# Patient Record
Sex: Female | Born: 1975 | Race: Black or African American | Hispanic: No | Marital: Single | Smoking: Current every day smoker
Health system: Southern US, Community
[De-identification: ages and names within clinical notes are randomized; demographics above are authoritative.]

## PROBLEM LIST (undated history)

## (undated) DIAGNOSIS — Z789 Other specified health status: Secondary | ICD-10-CM

## (undated) DIAGNOSIS — R51 Headache: Secondary | ICD-10-CM

## (undated) DIAGNOSIS — IMO0002 Reserved for concepts with insufficient information to code with codable children: Secondary | ICD-10-CM

## (undated) DIAGNOSIS — R519 Headache, unspecified: Secondary | ICD-10-CM

## (undated) DIAGNOSIS — D734 Cyst of spleen: Secondary | ICD-10-CM

## (undated) HISTORY — DX: Reserved for concepts with insufficient information to code with codable children: IMO0002

## (undated) HISTORY — PX: NO PAST SURGERIES: SHX2092

## (undated) HISTORY — DX: Headache, unspecified: R51.9

## (undated) HISTORY — DX: Headache: R51

## (undated) HISTORY — DX: Cyst of spleen: D73.4

---

## 1999-03-31 ENCOUNTER — Other Ambulatory Visit: Admission: RE | Admit: 1999-03-31 | Discharge: 1999-03-31 | Payer: Self-pay | Admitting: Obstetrics

## 1999-10-27 ENCOUNTER — Emergency Department (HOSPITAL_COMMUNITY): Admission: EM | Admit: 1999-10-27 | Discharge: 1999-10-27 | Payer: Self-pay | Admitting: Emergency Medicine

## 2002-02-06 ENCOUNTER — Emergency Department (HOSPITAL_COMMUNITY): Admission: EM | Admit: 2002-02-06 | Discharge: 2002-02-06 | Payer: Self-pay | Admitting: Emergency Medicine

## 2003-10-09 ENCOUNTER — Inpatient Hospital Stay (HOSPITAL_COMMUNITY): Admission: AD | Admit: 2003-10-09 | Discharge: 2003-10-11 | Payer: Self-pay | Admitting: Obstetrics

## 2007-07-08 ENCOUNTER — Emergency Department (HOSPITAL_COMMUNITY): Admission: EM | Admit: 2007-07-08 | Discharge: 2007-07-08 | Payer: Self-pay | Admitting: Family Medicine

## 2009-04-28 ENCOUNTER — Emergency Department (HOSPITAL_COMMUNITY): Admission: EM | Admit: 2009-04-28 | Discharge: 2009-04-28 | Payer: Self-pay | Admitting: Family Medicine

## 2009-10-26 ENCOUNTER — Emergency Department (HOSPITAL_COMMUNITY): Admission: EM | Admit: 2009-10-26 | Discharge: 2009-10-26 | Payer: Self-pay | Admitting: Emergency Medicine

## 2010-02-03 ENCOUNTER — Emergency Department (HOSPITAL_COMMUNITY): Admission: EM | Admit: 2010-02-03 | Discharge: 2010-02-03 | Payer: Self-pay | Admitting: Emergency Medicine

## 2011-03-15 LAB — POCT I-STAT, CHEM 8
Chloride: 109 mEq/L (ref 96–112)
Creatinine, Ser: 0.6 mg/dL (ref 0.4–1.2)
Glucose, Bld: 96 mg/dL (ref 70–99)
Hemoglobin: 13.3 g/dL (ref 12.0–15.0)
Potassium: 4.1 mEq/L (ref 3.5–5.1)

## 2011-03-15 LAB — POCT URINALYSIS DIP (DEVICE)
Hgb urine dipstick: NEGATIVE
Ketones, ur: NEGATIVE mg/dL
Protein, ur: 30 mg/dL — AB
Specific Gravity, Urine: 1.015 (ref 1.005–1.030)
pH: 8.5 — ABNORMAL HIGH (ref 5.0–8.0)

## 2011-03-15 LAB — DIFFERENTIAL
Basophils Absolute: 0 10*3/uL (ref 0.0–0.1)
Eosinophils Relative: 1 % (ref 0–5)
Lymphocytes Relative: 23 % (ref 12–46)
Monocytes Absolute: 0.9 10*3/uL (ref 0.1–1.0)

## 2011-03-15 LAB — POCT PREGNANCY, URINE: Preg Test, Ur: NEGATIVE

## 2011-03-15 LAB — CBC
HCT: 36.9 % (ref 36.0–46.0)
Hemoglobin: 12.4 g/dL (ref 12.0–15.0)
RDW: 14 % (ref 11.5–15.5)

## 2011-04-23 ENCOUNTER — Emergency Department (HOSPITAL_COMMUNITY)
Admission: EM | Admit: 2011-04-23 | Discharge: 2011-04-23 | Disposition: A | Discharge status: Home / Self Care | Payer: Medicaid Other | Attending: Emergency Medicine | Admitting: Emergency Medicine

## 2011-04-23 ENCOUNTER — Emergency Department (HOSPITAL_COMMUNITY): Payer: Medicaid Other

## 2011-04-23 DIAGNOSIS — M79609 Pain in unspecified limb: Secondary | ICD-10-CM | POA: Insufficient documentation

## 2011-04-23 DIAGNOSIS — S60229A Contusion of unspecified hand, initial encounter: Secondary | ICD-10-CM | POA: Insufficient documentation

## 2011-04-23 DIAGNOSIS — IMO0002 Reserved for concepts with insufficient information to code with codable children: Secondary | ICD-10-CM | POA: Insufficient documentation

## 2011-10-10 LAB — POCT URINALYSIS DIP (DEVICE)
Glucose, UA: NEGATIVE
Nitrite: NEGATIVE
Operator id: 235561
Protein, ur: 100 — AB
Specific Gravity, Urine: >=1.03
Urobilinogen, UA: 1
pH: 5.5

## 2011-10-10 LAB — CBC
HCT: 38.3
Hemoglobin: 12.6
MCHC: 32.9
MCV: 81.5
Platelets: 228
RBC: 4.7
RDW: 14.1 — ABNORMAL HIGH
WBC: 10.4

## 2011-10-10 LAB — I-STAT 8, (EC8 V) (CONVERTED LAB)
Acid-Base Excess: 1
BUN: 12
Bicarbonate: 26 — ABNORMAL HIGH
Chloride: 107
Glucose, Bld: 91
HCT: 46
Hemoglobin: 15.6 — ABNORMAL HIGH
Operator id: 235561
Potassium: 3.8
Sodium: 143
TCO2: 27
pCO2, Ven: 40.1 — ABNORMAL LOW
pH, Ven: 7.42 — ABNORMAL HIGH

## 2011-10-10 LAB — POCT I-STAT CREATININE
Creatinine, Ser: 0.8
Operator id: 235561

## 2011-10-10 LAB — DIFFERENTIAL
Basophils Absolute: 0
Basophils Relative: 0
Eosinophils Absolute: 0.2
Eosinophils Relative: 2
Lymphocytes Relative: 19
Lymphs Abs: 2
Monocytes Absolute: 0.6
Monocytes Relative: 6
Neutro Abs: 7.7
Neutrophils Relative %: 74

## 2011-10-10 LAB — POCT H PYLORI SCREEN: H. PYLORI SCREEN, POC: POSITIVE — AB

## 2011-10-20 ENCOUNTER — Emergency Department (HOSPITAL_COMMUNITY)
Admission: EM | Admit: 2011-10-20 | Discharge: 2011-10-20 | Disposition: A | Discharge status: Home / Self Care | Payer: Self-pay | Attending: Emergency Medicine | Admitting: Emergency Medicine

## 2011-10-20 DIAGNOSIS — IMO0001 Reserved for inherently not codable concepts without codable children: Secondary | ICD-10-CM | POA: Insufficient documentation

## 2011-10-20 DIAGNOSIS — J020 Streptococcal pharyngitis: Secondary | ICD-10-CM | POA: Insufficient documentation

## 2011-10-20 DIAGNOSIS — R197 Diarrhea, unspecified: Secondary | ICD-10-CM | POA: Insufficient documentation

## 2011-10-20 DIAGNOSIS — R599 Enlarged lymph nodes, unspecified: Secondary | ICD-10-CM | POA: Insufficient documentation

## 2011-10-20 LAB — RAPID STREP SCREEN (MED CTR MEBANE ONLY): Streptococcus, Group A Screen (Direct): POSITIVE — AB

## 2012-02-01 ENCOUNTER — Emergency Department (HOSPITAL_COMMUNITY): Payer: Medicaid Other

## 2012-02-01 ENCOUNTER — Encounter (HOSPITAL_COMMUNITY): Payer: Self-pay

## 2012-02-01 ENCOUNTER — Inpatient Hospital Stay (HOSPITAL_COMMUNITY)
Admission: EM | Admit: 2012-02-01 | Discharge: 2012-02-03 | DRG: 816 | Disposition: A | Discharge status: Home / Self Care | Payer: Medicaid Other | Attending: General Surgery | Admitting: General Surgery

## 2012-02-01 DIAGNOSIS — D72829 Elevated white blood cell count, unspecified: Secondary | ICD-10-CM | POA: Diagnosis present

## 2012-02-01 DIAGNOSIS — R109 Unspecified abdominal pain: Secondary | ICD-10-CM | POA: Diagnosis present

## 2012-02-01 DIAGNOSIS — F17200 Nicotine dependence, unspecified, uncomplicated: Secondary | ICD-10-CM | POA: Diagnosis present

## 2012-02-01 DIAGNOSIS — D735 Infarction of spleen: Secondary | ICD-10-CM

## 2012-02-01 DIAGNOSIS — Z791 Long term (current) use of non-steroidal anti-inflammatories (NSAID): Secondary | ICD-10-CM

## 2012-02-01 DIAGNOSIS — D7389 Other diseases of spleen: Principal | ICD-10-CM | POA: Diagnosis present

## 2012-02-01 DIAGNOSIS — D734 Cyst of spleen: Secondary | ICD-10-CM

## 2012-02-01 HISTORY — DX: Other specified health status: Z78.9

## 2012-02-01 LAB — URINALYSIS, ROUTINE W REFLEX MICROSCOPIC
Bilirubin Urine: NEGATIVE
Glucose, UA: NEGATIVE mg/dL
Hgb urine dipstick: NEGATIVE
Leukocytes, UA: NEGATIVE
Nitrite: NEGATIVE
Protein, ur: 30 mg/dL — AB
Specific Gravity, Urine: 1.029 (ref 1.005–1.030)
Urobilinogen, UA: 0.2 mg/dL (ref 0.0–1.0)
pH: 6 (ref 5.0–8.0)

## 2012-02-01 LAB — CBC
HCT: 35 % — ABNORMAL LOW (ref 36.0–46.0)
Hemoglobin: 11.7 g/dL — ABNORMAL LOW (ref 12.0–15.0)
MCH: 26.8 pg (ref 26.0–34.0)
MCHC: 33.4 g/dL (ref 30.0–36.0)
MCV: 80.1 fL (ref 78.0–100.0)
Platelets: 228 10*3/uL (ref 150–400)
RBC: 4.37 MIL/uL (ref 3.87–5.11)
RDW: 14 % (ref 11.5–15.5)
WBC: 17.1 K/uL — ABNORMAL HIGH (ref 4.0–10.5)

## 2012-02-01 LAB — DIFFERENTIAL
Basophils Absolute: 0 10*3/uL (ref 0.0–0.1)
Basophils Relative: 0 % (ref 0–1)
Eosinophils Absolute: 0 10*3/uL (ref 0.0–0.7)
Eosinophils Relative: 0 % (ref 0–5)
Lymphocytes Relative: 7 % — ABNORMAL LOW (ref 12–46)
Lymphs Abs: 1.2 K/uL (ref 0.7–4.0)
Monocytes Absolute: 0.5 K/uL (ref 0.1–1.0)
Monocytes Relative: 3 % (ref 3–12)
Neutro Abs: 15.4 K/uL — ABNORMAL HIGH (ref 1.7–7.7)
Neutrophils Relative %: 90 % — ABNORMAL HIGH (ref 43–77)

## 2012-02-01 LAB — COMPREHENSIVE METABOLIC PANEL
AST: 18 U/L (ref 0–37)
Albumin: 3.8 g/dL (ref 3.5–5.2)
Alkaline Phosphatase: 117 U/L (ref 39–117)
BUN: 12 mg/dL (ref 6–23)
Potassium: 4 mEq/L (ref 3.5–5.1)
Total Protein: 7.7 g/dL (ref 6.0–8.3)

## 2012-02-01 LAB — COMPREHENSIVE METABOLIC PANEL WITH GFR
ALT: 13 U/L (ref 0–35)
CO2: 24 meq/L (ref 19–32)
Calcium: 9.4 mg/dL (ref 8.4–10.5)
Chloride: 104 meq/L (ref 96–112)
Creatinine, Ser: 0.72 mg/dL (ref 0.50–1.10)
GFR calc Af Amer: >90 mL/min (ref >90)
GFR calc non Af Amer: >90 mL/min (ref >90)
Glucose, Bld: 80 mg/dL (ref 70–99)
Sodium: 139 meq/L (ref 135–145)
Total Bilirubin: 0.3 mg/dL (ref 0.3–1.2)

## 2012-02-01 LAB — POCT PREGNANCY, URINE: Preg Test, Ur: NEGATIVE

## 2012-02-01 LAB — LIPASE, BLOOD: Lipase: 30 U/L (ref 11–59)

## 2012-02-01 LAB — URINE MICROSCOPIC-ADD ON

## 2012-02-01 MED ORDER — HYDROCODONE-ACETAMINOPHEN 5-325 MG PO TABS
1.0000 | ORAL_TABLET | ORAL | Status: DC | PRN
  Administered 2012-02-02 – 2012-02-03 (×3): 2 via ORAL
  Filled 2012-02-01 (×4): qty 2

## 2012-02-01 MED ORDER — HYDROMORPHONE HCL PF 1 MG/ML IJ SOLN
0.5000 mg | Freq: Once | INTRAMUSCULAR | Status: AC
  Administered 2012-02-01: 0.5 mg via INTRAVENOUS
  Filled 2012-02-01: qty 1

## 2012-02-01 MED ORDER — KCL IN DEXTROSE-NACL 20-5-0.45 MEQ/L-%-% IV SOLN
INTRAVENOUS | Status: DC
  Administered 2012-02-01: 22:00:00 via INTRAVENOUS
  Filled 2012-02-01 (×5): qty 1000

## 2012-02-01 MED ORDER — PANTOPRAZOLE SODIUM 40 MG IV SOLR
40.0000 mg | Freq: Two times a day (BID) | INTRAVENOUS | Status: DC
  Administered 2012-02-01: 40 mg via INTRAVENOUS
  Filled 2012-02-01 (×3): qty 40

## 2012-02-01 MED ORDER — INFLUENZA VIRUS VACC SPLIT PF IM SUSP
0.5000 mL | INTRAMUSCULAR | Status: AC
  Administered 2012-02-02: 0.5 mL via INTRAMUSCULAR
  Filled 2012-02-01: qty 0.5

## 2012-02-01 MED ORDER — GI COCKTAIL ~~LOC~~
30.0000 mL | Freq: Once | ORAL | Status: AC
  Administered 2012-02-01: 30 mL via ORAL
  Filled 2012-02-01: qty 30

## 2012-02-01 MED ORDER — ACETAMINOPHEN 325 MG PO TABS: 650.0000 mg | ORAL_TABLET | Freq: Four times a day (QID) | ORAL | Status: DC | PRN

## 2012-02-01 MED ORDER — SODIUM CHLORIDE 0.9 % IV SOLN
999.0000 mL | INTRAVENOUS | Status: DC
  Administered 2012-02-01: 1000 mL via INTRAVENOUS

## 2012-02-01 MED ORDER — IOHEXOL 300 MG/ML  SOLN
100.0000 mL | Freq: Once | INTRAMUSCULAR | Status: AC | PRN
  Administered 2012-02-01: 100 mL via INTRAVENOUS

## 2012-02-01 MED ORDER — HYDROMORPHONE HCL PF 1 MG/ML IJ SOLN
0.5000 mg | INTRAMUSCULAR | Status: DC | PRN
  Administered 2012-02-01: 1 mg via INTRAVENOUS
  Administered 2012-02-02: 0.5 mg via INTRAVENOUS
  Administered 2012-02-02: 1 mg via INTRAVENOUS
  Filled 2012-02-01 (×3): qty 1

## 2012-02-01 MED ORDER — ONDANSETRON HCL 4 MG/2ML IJ SOLN
4.0000 mg | Freq: Four times a day (QID) | INTRAMUSCULAR | Status: DC | PRN
  Administered 2012-02-01 – 2012-02-02 (×2): 4 mg via INTRAVENOUS
  Filled 2012-02-01 (×2): qty 2

## 2012-02-01 MED ORDER — ACETAMINOPHEN 650 MG RE SUPP: 650.0000 mg | Freq: Four times a day (QID) | RECTAL | Status: DC | PRN

## 2012-02-01 MED ORDER — ONDANSETRON 8 MG PO TBDP
8.0000 mg | ORAL_TABLET | Freq: Once | ORAL | Status: AC
  Administered 2012-02-01: 8 mg via ORAL
  Filled 2012-02-01: qty 1

## 2012-02-01 MED ORDER — PIPERACILLIN-TAZOBACTAM 3.375 G IVPB
3.3750 g | Freq: Three times a day (TID) | INTRAVENOUS | Status: DC
  Administered 2012-02-01 – 2012-02-02 (×2): 3.375 g via INTRAVENOUS
  Filled 2012-02-01 (×4): qty 50

## 2012-02-01 MED ORDER — HYDROMORPHONE HCL PF 1 MG/ML IJ SOLN
1.0000 mg | Freq: Once | INTRAMUSCULAR | Status: AC
  Administered 2012-02-01: 1 mg via INTRAVENOUS
  Filled 2012-02-01: qty 1

## 2012-02-01 NOTE — ED Notes (Signed)
Patient reports that she went to work this AM and began having a sudden onset of right upper abd pain that worsened. Patient states she did not eat breakfast this AM.  Patient denies vomiting. Patient states she had a normal BM this AM

## 2012-02-01 NOTE — H&P (Signed)
Taylor Bernard is an 36 y.o. female.   Chief Complaint: Abdominal pain Primary Care: none HPI: 36 year old Afro-American female who was in her normal state of health this morning. She was at work and was getting on the computer when she had abrupt onset of pain mid epigastric, and going to her left upper quadrant. When it first occurred she was sweating, but this resolved. For pain seems to wax and wane. She presented to the ER, pain was relieved by Dilaudid but has subsequently returned. She denies any nausea, vomiting, diarrhea, constipation, or blood in her stool. She's never had this pain before. She was seen in 2008 have positive H. pylori. She also uses nonsteroidals frequently for headaches. She continues to have some discomfort, she was recently treated with some pain medicine, still somewhat tender to palpation in her midabdomen. We were asked to see and evaluate. Abdominal ultrasound shows a normal gallbladder with no gallstones, common bile duct was normal, liver was normal, there is mild ascites there was a complex 10 cm cystic lesion in the spleen. This led to a CT scan that shows a large irregular marginated septated lesion in the spleen 10.2 x 7.4 x 9.8 cm there is a large amount of fluid throughout the peritoneal cavity with the same density as the fluid in the cyst. Suggesting a aerobic sure of the splenic cyst. Patient has no history of trauma, and no other significant medical history.  Past Medical History  Diagnosis Date  . No pertinent past medical history   frequent headaches, about 1 per week for which she take NSAID,  They normally last about 1 day.  Past Surgical History  Procedure Date  . No past surgeries     Family History  Problem Relation Age of Onset  . Hypertension Mother   . Heart failure Mother   . Hypertension Father   Mother with hx of renal failure, father with hx of Cirrhosis    Social History:  reports that she has been smoking.  She has never used  smokeless tobacco. She reports that she drinks alcohol. She reports that she does not use illicit drugs. 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  Home meds;  Nexplanon implantable birth control, Ibuprofen PRN for headaches    Allergies: No Known Allergies  Medications Prior to Admission  Medication Dose Route Frequency Provider Last Rate Last Dose  . 0.9 %  sodium chloride infusion  999 mL Intravenous Continuous Gavin Pound. Ghim, MD 999 mL/hr at 02/01/12 1135 1,000 mL at 02/01/12 1135  . gi cocktail  30 mL Oral Once Gavin Pound. Ghim, MD   30 mL at 02/01/12 0957  . HYDROmorphone (DILAUDID) injection 0.5 mg  0.5 mg Intravenous Once Gavin Pound. Ghim, MD   0.5 mg at 02/01/12 1609  . HYDROmorphone (DILAUDID) injection 1 mg  1 mg Intravenous Once Gavin Pound. Ghim, MD   1 mg at 02/01/12 1133  . influenza  inactive virus vaccine (FLUZONE/FLUARIX) injection 0.5 mL  0.5 mL Intramuscular Tomorrow-1000 Hurman Horn, MD      . iohexol (OMNIPAQUE) 300 MG/ML solution 100 mL  100 mL Intravenous Once PRN Medication Radiologist, MD   100 mL at 02/01/12 1556  . ondansetron (ZOFRAN-ODT) disintegrating tablet 8 mg  8 mg Oral Once Gavin Pound. Ghim, MD   8 mg at 02/01/12 0934   No current outpatient prescriptions on file as of 02/01/2012.  Results for orders placed during the hospital encounter of 02/01/12 (from the past 48 hour(s))  POCT PREGNANCY, URINE     Status: Normal   Collection Time   02/01/12  9:54 AM      Component Value Range Comment   Preg Test, Ur NEGATIVE  NEGATIVE    URINALYSIS, ROUTINE W REFLEX MICROSCOPIC     Status: Abnormal   Collection Time   02/01/12  9:54 AM      Component Value Range Comment   Color, Urine AMBER (*) YELLOW  BIOCHEMICALS MAY BE AFFECTED BY COLOR   APPearance CLOUDY (*) CLEAR     Specific Gravity, Urine 1.029  1.005 - 1.030     pH 6.0  5.0 - 8.0     Glucose, UA NEGATIVE  NEGATIVE (mg/dL)    Hgb urine dipstick NEGATIVE  NEGATIVE      Bilirubin Urine NEGATIVE  NEGATIVE     Ketones, ur TRACE (*) NEGATIVE (mg/dL)    Protein, ur 30 (*) NEGATIVE (mg/dL)    Urobilinogen, UA 0.2  0.0 - 1.0 (mg/dL)    Nitrite NEGATIVE  NEGATIVE     Leukocytes, UA NEGATIVE  NEGATIVE    URINE MICROSCOPIC-ADD ON     Status: Abnormal   Collection Time   02/01/12  9:54 AM      Component Value Range Comment   Squamous Epithelial / LPF RARE  RARE     WBC, UA 0-2  <3 (WBC/hpf)    Bacteria, UA MANY (*) RARE     Urine-Other MUCOUS PRESENT     CBC     Status: Abnormal   Collection Time   02/01/12 11:30 AM      Component Value Range Comment   WBC 17.1 (*) 4.0 - 10.5 (K/uL)    RBC 4.37  3.87 - 5.11 (MIL/uL)    Hemoglobin 11.7 (*) 12.0 - 15.0 (g/dL)    HCT 16.1 (*) 09.6 - 46.0 (%)    MCV 80.1  78.0 - 100.0 (fL)    MCH 26.8  26.0 - 34.0 (pg)    MCHC 33.4  30.0 - 36.0 (g/dL)    RDW 04.5  40.9 - 81.1 (%)    Platelets 228  150 - 400 (K/uL)   DIFFERENTIAL     Status: Abnormal   Collection Time   02/01/12 11:30 AM      Component Value Range Comment   Neutrophils Relative 90 (*) 43 - 77 (%)    Neutro Abs 15.4 (*) 1.7 - 7.7 (K/uL)    Lymphocytes Relative 7 (*) 12 - 46 (%)    Lymphs Abs 1.2  0.7 - 4.0 (K/uL)    Monocytes Relative 3  3 - 12 (%)    Monocytes Absolute 0.5  0.1 - 1.0 (K/uL)    Eosinophils Relative 0  0 - 5 (%)    Eosinophils Absolute 0.0  0.0 - 0.7 (K/uL)    Basophils Relative 0  0 - 1 (%)    Basophils Absolute 0.0  0.0 - 0.1 (K/uL)   COMPREHENSIVE METABOLIC PANEL     Status: Normal   Collection Time   02/01/12 11:30 AM      Component Value Range Comment   Sodium 139  135 - 145 (mEq/L)    Potassium 4.0  3.5 - 5.1 (mEq/L)    Chloride 104  96 - 112 (mEq/L)    CO2 24  19 - 32 (mEq/L)    Glucose, Bld 80  70 - 99 (mg/dL)  BUN 12  6 - 23 (mg/dL)    Creatinine, Ser 4.78  0.50 - 1.10 (mg/dL)    Calcium 9.4  8.4 - 10.5 (mg/dL)    Total Protein 7.7  6.0 - 8.3 (g/dL)    Albumin 3.8  3.5 - 5.2 (g/dL)    AST 18  0 - 37 (U/L)    ALT 13  0 -  35 (U/L)    Alkaline Phosphatase 117  39 - 117 (U/L)    Total Bilirubin 0.3  0.3 - 1.2 (mg/dL)    GFR calc non Af Amer >90  >90 (mL/min)    GFR calc Af Amer >90  >90 (mL/min)   LIPASE, BLOOD     Status: Normal   Collection Time   02/01/12 11:30 AM      Component Value Range Comment   Lipase 30  11 - 59 (U/L)    US Abdomen Complete  02/01/2012  *RADIOLOGY REPORT*  Clinical Data:  Abdominal pain and night sweats  ABDOMINAL ULTRASOUND COMPLETE  Comparison:  None.  Findings:  Gallbladder:  No gallstones, gallbladder wall thickening, or pericholecystic fluid. Negative sonographic Murphy's sign.  Common Bile Duct:  Within normal limits in caliber. Measures 3 mm.  Liver: No focal mass lesion identified.  Within normal limits in parenchymal echogenicity.  There is mild ascites.  IVC:  Appears normal.  Pancreas:  No abnormality identified.  Spleen:  There is a 10 cm complex cystic area with irregular margins and internal septations.  Right kidney:  Normal in size and parenchymal echogenicity.  No evidence of mass or hydronephrosis.  Left kidney:  Normal in size and parenchymal echogenicity.  No evidence of mass or hydronephrosis.  Abdominal Aorta:  No aneurysm identified.  IMPRESSION:  Ascites.  10 cm complex cystic lesion within the spleen.  Differential diagnosis includes prior splenic hemorrhage or infarction, abscess (bacterial, fungal,  or echinococcal), lymphangioma or hemangioma. Neoplasm is felt less likely but lymphoma cannot be completely excluded.  Original Report Authenticated By: Brandon Melnick, M.D.   Ct Abdomen Pelvis W Contrast  02/01/2012  *RADIOLOGY REPORT*  Clinical Data: Left upper quadrant pain for 1 day  CT ABDOMEN AND PELVIS WITH CONTRAST  Technique:  Multidetector CT imaging of the abdomen and pelvis was performed following the standard protocol during bolus administration of intravenous contrast.  Contrast: OMNIPAQUE IOHEXOL 300 MG/ML IV SOLN  Comparison: None.  Findings: There is  mild linear atelectasis of the left lung base. Developing pneumonia cannot be excluded.  No effusion is seen.  The liver enhances with no focal abnormality and no ductal dilatation is seen.  No calcified gallstones are noted.  The pancreas is normal in size and the pancreatic duct is not dilated.  The adrenal glands are unremarkable.   However, there is a large low attenuation irregularly marginated lesion within the spleen measuring 10.2 x 7.4 x 9.8 cm with an attenuation of 20 HU.  There is perhaps slight enhancement anteriorly within the splenic adjacent parenchyma and there is peripheral calcification within this large splenic somewhat loculated lesion.  The primary considerations are that of a splenic cyst either congenital, post-traumatic, or inflammatory with other considerations being that of a hemangioma, or lymphangioma. However, there is fluid scattered throughout the peritoneal cavity and layering dependently within the pelvis all of which has an attenuation close to 20 Hounsfield units.  The splenic lesion may have ruptured with the contents spilling into the peritoneal cavity.  The attenuation does not appear  to be typical of acute blood however.  The kidneys enhance with no calculus or mass and no hydronephrosis is seen.  The abdominal aorta is normal in caliber. No adenopathy is seen.  A large amount of free fluid layers within the pelvis, with similar attenuation to the fluid within and surrounding the splenic cystic lesion.  The uterus is normal in size.  The ovaries appear to contain multiple small follicles.  No pelvic mass or adenopathy is seen.  The colon is largely decompressed.  The appendix and terminal ileum are unremarkable.  No bony abnormality is seen.  IMPRESSION:  1.  Large irregularly marginated septated lesion within the spleen of 10.2 x 7.4 x 9.8 cm. 2.  Moderate to large amount of fluid throughout the peritoneal cavity with a similar attenuation to the fluid within the spleen. This  may indicate rupture of the splenic lesion with the fluid contents extending throughout the peritoneal cavity.  Original Report Authenticated By: Juline Patch, M.D.    Review of Systems  Constitutional: Negative for fever, chills, weight loss and malaise/fatigue. Diaphoresis: with onset of pain, then resolved.       Says she got diaphoretic with onset of pain, but sudden onset.  No symptoms prior to that.  HENT: Negative.   Eyes: Negative.   Respiratory: Negative.   Cardiovascular: Negative.   Gastrointestinal: Negative.   Genitourinary: Negative.        Periods q 3 months which is normal for her.  Musculoskeletal: Negative.   Skin: Negative.        Multiple tatoo's  Neurological: Negative.  Negative for weakness.  Endo/Heme/Allergies: Negative.   Psychiatric/Behavioral: Negative.     Blood pressure 105/65, pulse 74, temperature 98.6 F (37 C), temperature source Oral, resp. rate 18, last menstrual period 11/10/2011, SpO2 100.00%. Physical Exam  Constitutional: She appears well-developed and well-nourished. No distress.  HENT:  Head: Normocephalic.  Nose: Nose normal.  Mouth/Throat: Oropharynx is clear and moist.  Eyes: Conjunctivae and EOM are normal. Pupils are equal, round, and reactive to light.  Neck: Normal range of motion. Neck supple. No JVD present. No tracheal deviation present. No thyromegaly present.  Cardiovascular: Normal rate, regular rhythm, normal heart sounds and intact distal pulses.  Exam reveals no gallop and no friction rub.   No murmur heard. Respiratory: Effort normal and breath sounds normal. No respiratory distress. She has no wheezes. She has no rales. She exhibits no tenderness.  GI: Soft. Bowel sounds are normal. She exhibits no distension and no mass. There is tenderness (primary pain is mid portion of abd below xyphoid, and extending to LUQ.). There is no rebound and no guarding.  Musculoskeletal: Normal range of motion. She exhibits no edema.    Lymphadenopathy:    She has no cervical adenopathy.  Neurological: She is alert. No cranial nerve deficit.  Skin: Skin is warm and dry. No rash noted. She is not diaphoretic. No erythema. No pallor.       Multiple tatoo's  Psychiatric: She has a normal mood and affect. Her behavior is normal. Judgment and thought content normal.     Assessment/Plan 1. Abdominal pain with a large splenic cyst, and possible rupture. 2. History of H. Pylori, 3. History of NSAID use. 4. Tobacco use 5. Leukocytosis  Plan: We'll admit the patient, and place her on clear liquids tonight. We'll start some antibiotic and repeat her labs in the a.m. pain control, hydration, further evaluation as needed.  [facility]  [facility] [phone]  02/01/2012 6:14 PM  Taylor Bernard 02/01/2012, 5:50 PM

## 2012-02-01 NOTE — Progress Notes (Signed)
ANTIBIOTIC CONSULT NOTE - INITIAL  Pharmacy Consult for Zosyn Indication: possible splenic cyst rupture  No Known Allergies  Patient Measurements:    Vital Signs: Temp: 98.6 F (37 C) (02/07 1541) Temp src: Oral (02/07 1541) BP: 105/65 mmHg (02/07 1541) Pulse Rate: 74  (02/07 1541)  Labs:  Basename 02/01/12 1130  WBC 17.1*  HGB 11.7*  PLT 228  LABCREA --  CREATININE 0.72   CrCl is unknown because there is no height on file for the current visit.  Microbiology: No results found for this or any previous visit (from the past 720 hour(s)).  Medical History: Past Medical History  Diagnosis Date  . No pertinent past medical history     Medications:  Scheduled:    . gi cocktail  30 mL Oral Once  .  HYDROmorphone (DILAUDID) injection  0.5 mg Intravenous Once  . HYDROmorphone  1 mg Intravenous Once  . influenza  inactive virus vaccine  0.5 mL Intramuscular Tomorrow-1000  . ondansetron  8 mg Oral Once  . pantoprazole (PROTONIX) IV  40 mg Intravenous Q12H  . piperacillin-tazobactam (ZOSYN)  IV  3.375 g Intravenous Q8H   Infusions:    . sodium chloride 1,000 mL (02/01/12 1135)  . dextrose 5 % and 0.45 % NaCl with KCl 20 mEq/L     Assessment: 35YOF presenting with abdominal pain with a large splenic cyst, and possible rupture to begin empiric Zosyn.  SCr 0.72.  Goal of Therapy:  doses adjusted per renal clearance  Plan:  Zosyn 3.375g IV q8h (4 hour infusion time).  Clance Boll 02/01/2012,7:01 PM

## 2012-02-01 NOTE — ED Provider Notes (Signed)
History     CSN: [id number]  Arrival date & time 02/01/12  4098   First MD Initiated Contact with Patient 02/01/12 [phone]      Chief Complaint  Patient presents with  . Abdominal Pain    (Consider location/radiation/quality/duration/timing/severity/associated sxs/prior treatment) HPI Comments: Patient reports that she was in her usual state of health, had gotten up early in order to get work. She does not usually eat breakfast. 30 minutes after getting to work she began having some sharp upper epigastric discomfort that was nonradiating. She reports she went to the bathroom and had a bowel movement as well as urinated to see if that would help and she reports not feeling any better. She denies any vaginal bleeding or discharge. She denies dysuria or flank pain. She denies fever or chills. She did have an episode of nausea and had some dry heaving. She reports her bowel movement appeared normal, without black or tarry there is no blood. The pain has remained constant although it seems to wax and wane somewhat. She reports it has some burning quality. She reports initially when it was at its worst, it did hurt to breathe and she had to double over in pain. She reports laying on her back and stretching out seems to make the pain worse. She reports she was slightly bloated and felt a little bit more gassy than usual but not significantly so. She denies any prior surgeries. She denies ever being told that she had an ulcer or reflux. However in review of her old records, she has had H. pylori test performed in the past.  Patient is a 36 y.o. female presenting with abdominal pain. The history is provided by the patient and a relative.  Abdominal Pain The primary symptoms of the illness include abdominal pain, nausea and vomiting. The primary symptoms of the illness do not include diarrhea, dysuria, vaginal discharge or vaginal bleeding.    History reviewed. No pertinent past medical history.  History  reviewed. No pertinent past surgical history.  Family History  Problem Relation Age of Onset  . Hypertension Mother   . Heart failure Mother   . Hypertension Father     History  Substance Use Topics  . Smoking status: Current Everyday Smoker -- 0.5 packs/day for 5 years  . Smokeless tobacco: Never Used  . Alcohol Use: Yes     occasionally    OB History    Grav Para Term Preterm Abortions TAB SAB Ect Mult Living                  Review of Systems  Gastrointestinal: Positive for nausea, vomiting and abdominal pain. Negative for diarrhea.  Genitourinary: Negative for dysuria, vaginal bleeding and vaginal discharge.  All other systems reviewed and are negative.    Allergies  Review of patient's allergies indicates no known allergies.  Home Medications   Current Outpatient Rx  Name Route Sig Dispense Refill  . IBUPROFEN 200 MG PO TABS Oral Take 600 mg by mouth every 6 (six) hours as needed. For migraine    . MELATONIN 5 MG PO TABS Oral Take 1 tablet by mouth daily.    Joan Flores ESSENTIALS PO Oral Take 1 tablet by mouth daily. otc      BP 105/65  Pulse 74  Temp(Src) 98.6 F (37 C) (Oral)  Resp 18  SpO2 100%  Physical Exam  Nursing note and vitals reviewed. Constitutional: She is oriented to person, place, and time. She appears well-developed  and well-nourished. No distress.  HENT:  Head: Normocephalic and atraumatic.  Mouth/Throat: Oropharynx is clear and moist and mucous membranes are normal.  Eyes: No scleral icterus.  Neck: Neck supple.  Pulmonary/Chest: Effort normal. No respiratory distress.  Abdominal: Soft. Normal appearance and bowel sounds are normal. She exhibits no distension. There is no hepatosplenomegaly. There is tenderness in the epigastric area and left upper quadrant. There is no rebound, no guarding, no CVA tenderness, no tenderness at McBurney's point and negative Murphy's sign. No hernia.    Musculoskeletal: She exhibits no edema and no  tenderness.  Neurological: She is alert and oriented to person, place, and time.  Skin: Skin is warm and dry. No rash noted. She is not diaphoretic. No erythema.  Psychiatric: She has a normal mood and affect.    ED Course  Procedures (including critical care time)  Labs Reviewed  CBC - Abnormal; Notable for the following:    WBC 17.1 (*)    Hemoglobin 11.7 (*)    HCT 35.0 (*)    All other components within normal limits  DIFFERENTIAL - Abnormal; Notable for the following:    Neutrophils Relative 90 (*)    Neutro Abs 15.4 (*)    Lymphocytes Relative 7 (*)    All other components within normal limits  URINALYSIS, ROUTINE W REFLEX MICROSCOPIC - Abnormal; Notable for the following:    Color, Urine AMBER (*) BIOCHEMICALS MAY BE AFFECTED BY COLOR   APPearance CLOUDY (*)    Ketones, ur TRACE (*)    Protein, ur 30 (*)    All other components within normal limits  URINE MICROSCOPIC-ADD ON - Abnormal; Notable for the following:    Bacteria, UA MANY (*)    All other components within normal limits  POCT PREGNANCY, URINE  COMPREHENSIVE METABOLIC PANEL  LIPASE, BLOOD   US Abdomen Complete  02/01/2012  *RADIOLOGY REPORT*  Clinical Data:  Abdominal pain and night sweats  ABDOMINAL ULTRASOUND COMPLETE  Comparison:  None.  Findings:  Gallbladder:  No gallstones, gallbladder wall thickening, or pericholecystic fluid. Negative sonographic Murphy's sign.  Common Bile Duct:  Within normal limits in caliber. Measures 3 mm.  Liver: No focal mass lesion identified.  Within normal limits in parenchymal echogenicity.  There is mild ascites.  IVC:  Appears normal.  Pancreas:  No abnormality identified.  Spleen:  There is a 10 cm complex cystic area with irregular margins and internal septations.  Right kidney:  Normal in size and parenchymal echogenicity.  No evidence of mass or hydronephrosis.  Left kidney:  Normal in size and parenchymal echogenicity.  No evidence of mass or hydronephrosis.  Abdominal  Aorta:  No aneurysm identified.  IMPRESSION:  Ascites.  10 cm complex cystic lesion within the spleen.  Differential diagnosis includes prior splenic hemorrhage or infarction, abscess (bacterial, fungal,  or echinococcal), lymphangioma or hemangioma. Neoplasm is felt less likely but lymphoma cannot be completely excluded.  Original Report Authenticated By: Brandon Melnick, M.D.   Ct Abdomen Pelvis W Contrast  02/01/2012  *RADIOLOGY REPORT*  Clinical Data: Left upper quadrant pain for 1 day  CT ABDOMEN AND PELVIS WITH CONTRAST  Technique:  Multidetector CT imaging of the abdomen and pelvis was performed following the standard protocol during bolus administration of intravenous contrast.  Contrast: OMNIPAQUE IOHEXOL 300 MG/ML IV SOLN  Comparison: None.  Findings: There is mild linear atelectasis of the left lung base. Developing pneumonia cannot be excluded.  No effusion is seen.  The liver  enhances with no focal abnormality and no ductal dilatation is seen.  No calcified gallstones are noted.  The pancreas is normal in size and the pancreatic duct is not dilated.  The adrenal glands are unremarkable.   However, there is a large low attenuation irregularly marginated lesion within the spleen measuring 10.2 x 7.4 x 9.8 cm with an attenuation of 20 HU.  There is perhaps slight enhancement anteriorly within the splenic adjacent parenchyma and there is peripheral calcification within this large splenic somewhat loculated lesion.  The primary considerations are that of a splenic cyst either congenital, post-traumatic, or inflammatory with other considerations being that of a hemangioma, or lymphangioma. However, there is fluid scattered throughout the peritoneal cavity and layering dependently within the pelvis all of which has an attenuation close to 20 Hounsfield units.  The splenic lesion may have ruptured with the contents spilling into the peritoneal cavity.  The attenuation does not appear to be typical of  acute blood however.  The kidneys enhance with no calculus or mass and no hydronephrosis is seen.  The abdominal aorta is normal in caliber. No adenopathy is seen.  A large amount of free fluid layers within the pelvis, with similar attenuation to the fluid within and surrounding the splenic cystic lesion.  The uterus is normal in size.  The ovaries appear to contain multiple small follicles.  No pelvic mass or adenopathy is seen.  The colon is largely decompressed.  The appendix and terminal ileum are unremarkable.  No bony abnormality is seen.  IMPRESSION:  1.  Large irregularly marginated septated lesion within the spleen of 10.2 x 7.4 x 9.8 cm. 2.  Moderate to large amount of fluid throughout the peritoneal cavity with a similar attenuation to the fluid within the spleen. This may indicate rupture of the splenic lesion with the fluid contents extending throughout the peritoneal cavity.  Original Report Authenticated By: Juline Patch, M.D.     1. Splenic cyst   2. Nontraumatic splenic rupture       MDM  Patient does not appear to be in any significant distress. Based on her old records as well as her smoking occasionally drinking and she does endorse having stress at work, I suspect she may have an early ulcer or certainly has symptoms from reflux disease. My plan is to administer some oral Zofran for her nausea and then try a GI cocktail and reassess for improvement. She reports that she does take ibuprofen on a fairly regular basis for occasional migraines.      11:08 AM Patient reports no significant improvement after the GI cocktail at all. Plan is to get labs place an IV and give IV analgesics and antiemetics. Since her discomfort and some in the upper epigastric region, I will obtain a ultrasound to assess for possible biliary colic or pancreatitis.  2:02 PM Pt reports feel improved, but still mild pain and tender still along upper abdomen and epigastric region. U/S is concerning due  to ascites adn also cyst at spleen.  Will get CT with contrast.  Pt has no prior h/o known liver disease.     4:52 PM Spoke to Dr. Gwinda Passe, he or colleague will see pt in the ED.    Gavin Pound. Braxtin Bamba, MD 02/01/12 1652

## 2012-02-01 NOTE — H&P (Signed)
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 Agree with plan. D.[facility]

## 2012-02-02 LAB — COMPREHENSIVE METABOLIC PANEL
Albumin: 3.1 g/dL — ABNORMAL LOW (ref 3.5–5.2)
BUN: 9 mg/dL (ref 6–23)
Calcium: 8.8 mg/dL (ref 8.4–10.5)
Creatinine, Ser: 0.79 mg/dL (ref 0.50–1.10)
Total Protein: 6.6 g/dL (ref 6.0–8.3)

## 2012-02-02 LAB — PROTIME-INR
INR: 1.13 (ref 0.00–1.49)
Prothrombin Time: 14.7 seconds (ref 11.6–15.2)

## 2012-02-02 LAB — CBC
HCT: 31.4 % — ABNORMAL LOW (ref 36.0–46.0)
MCV: 80.3 fL (ref 78.0–100.0)
Platelets: 201 10*3/uL (ref 150–400)
RBC: 3.91 MIL/uL (ref 3.87–5.11)
WBC: 10 10*3/uL (ref 4.0–10.5)

## 2012-02-02 MED ORDER — ACETAMINOPHEN 325 MG PO TABS: 650.0000 mg | ORAL_TABLET | Freq: Four times a day (QID) | ORAL | Status: AC | PRN

## 2012-02-02 MED ORDER — SODIUM CHLORIDE 0.9 % IV SOLN
999.0000 mL | INTRAVENOUS | Status: DC
  Administered 2012-02-02: 999 mL via INTRAVENOUS

## 2012-02-02 MED ORDER — HYDROCODONE-ACETAMINOPHEN 5-325 MG PO TABS: 1.0000 | ORAL_TABLET | ORAL | Status: AC | PRN

## 2012-02-02 NOTE — Progress Notes (Signed)
Subjective: Pain's better, still some discomfort, no nausea, fever.  She's hungry.  Objective: Vital signs in last 24 hours: Temp:  [98 F (36.7 C)-99.4 F (37.4 C)] 98.6 F (37 C) (02/08 0600) Pulse Rate:  [74-91] 76  (02/08 0600) Resp:  [16-18] 16  (02/08 0600) BP: (93-108)/(52-71) 95/63 mmHg (02/08 0600) SpO2:  [91 %-100 %] 100 % (02/08 0600) Weight:  [142 lb 13.7 oz (64.8 kg)] 142 lb 13.7 oz (64.8 kg) (02/07 2100) Last BM Date: 02/01/12  Intake/Output from previous day: 02/07 0701 - 02/08 0700 In: 800 [P.O.:800] Out: 900 [Urine:900] Intake/Output this shift:    PE:  Alert, no complaints, Chest clear. Abd: soft non tender just a bit sore.  Lab Results:   Basename 02/02/12 0344 02/01/12 1130  WBC 10.0 17.1*  HGB 10.5* 11.7*  HCT 31.4* 35.0*  PLT 201 228    BMET  Basename 02/02/12 0344 02/01/12 1130  NA 136 139  K 3.7 4.0  CL 102 104  CO2 25 24  GLUCOSE 100* 80  BUN 9 12  CREATININE 0.79 0.72  CALCIUM 8.8 9.4   PT/INR  Basename 02/02/12 0344  LABPROT 14.7  INR 1.13     Studies/Results: US Abdomen Complete  02/01/2012  *RADIOLOGY REPORT*  Clinical Data:  Abdominal pain and night sweats  ABDOMINAL ULTRASOUND COMPLETE  Comparison:  None.  Findings:  Gallbladder:  No gallstones, gallbladder wall thickening, or pericholecystic fluid. Negative sonographic Murphy's sign.  Common Bile Duct:  Within normal limits in caliber. Measures 3 mm.  Liver: No focal mass lesion identified.  Within normal limits in parenchymal echogenicity.  There is mild ascites.  IVC:  Appears normal.  Pancreas:  No abnormality identified.  Spleen:  There is a 10 cm complex cystic area with irregular margins and internal septations.  Right kidney:  Normal in size and parenchymal echogenicity.  No evidence of mass or hydronephrosis.  Left kidney:  Normal in size and parenchymal echogenicity.  No evidence of mass or hydronephrosis.  Abdominal Aorta:  No aneurysm identified.  IMPRESSION:   Ascites.  10 cm complex cystic lesion within the spleen.  Differential diagnosis includes prior splenic hemorrhage or infarction, abscess (bacterial, fungal,  or echinococcal), lymphangioma or hemangioma. Neoplasm is felt less likely but lymphoma cannot be completely excluded.  Original Report Authenticated By: Brandon Melnick, M.D.   Ct Abdomen Pelvis W Contrast  02/01/2012  *RADIOLOGY REPORT*  Clinical Data: Left upper quadrant pain for 1 day  CT ABDOMEN AND PELVIS WITH CONTRAST  Technique:  Multidetector CT imaging of the abdomen and pelvis was performed following the standard protocol during bolus administration of intravenous contrast.  Contrast: OMNIPAQUE IOHEXOL 300 MG/ML IV SOLN  Comparison: None.  Findings: There is mild linear atelectasis of the left lung base. Developing pneumonia cannot be excluded.  No effusion is seen.  The liver enhances with no focal abnormality and no ductal dilatation is seen.  No calcified gallstones are noted.  The pancreas is normal in size and the pancreatic duct is not dilated.  The adrenal glands are unremarkable.   However, there is a large low attenuation irregularly marginated lesion within the spleen measuring 10.2 x 7.4 x 9.8 cm with an attenuation of 20 HU.  There is perhaps slight enhancement anteriorly within the splenic adjacent parenchyma and there is peripheral calcification within this large splenic somewhat loculated lesion.  The primary considerations are that of a splenic cyst either congenital, post-traumatic, or inflammatory with other considerations being  that of a hemangioma, or lymphangioma. However, there is fluid scattered throughout the peritoneal cavity and layering dependently within the pelvis all of which has an attenuation close to 20 Hounsfield units.  The splenic lesion may have ruptured with the contents spilling into the peritoneal cavity.  The attenuation does not appear to be typical of acute blood however.  The kidneys enhance with no  calculus or mass and no hydronephrosis is seen.  The abdominal aorta is normal in caliber. No adenopathy is seen.  A large amount of free fluid layers within the pelvis, with similar attenuation to the fluid within and surrounding the splenic cystic lesion.  The uterus is normal in size.  The ovaries appear to contain multiple small follicles.  No pelvic mass or adenopathy is seen.  The colon is largely decompressed.  The appendix and terminal ileum are unremarkable.  No bony abnormality is seen.  IMPRESSION:  1.  Large irregularly marginated septated lesion within the spleen of 10.2 x 7.4 x 9.8 cm. 2.  Moderate to large amount of fluid throughout the peritoneal cavity with a similar attenuation to the fluid within the spleen. This may indicate rupture of the splenic lesion with the fluid contents extending throughout the peritoneal cavity.  Original Report Authenticated By: Juline Patch, M.D.    Anti-infectives: Anti-infectives     Start     Dose/Rate Route Frequency Ordered Stop   02/01/12 2000  piperacillin-tazobactam (ZOSYN) IVPB 3.375 g       3.375 g 12.5 mL/hr over 240 Minutes Intravenous Every 8 hours 02/01/12 1858           Current Facility-Administered Medications  Medication Dose Route Frequency Provider Last Rate Last Dose  . 0.9 %  sodium chloride infusion  999 mL Intravenous Continuous Gavin Pound. Ghim, MD 999 mL/hr at 02/01/12 1135 1,000 mL at 02/01/12 1135  . acetaminophen (TYLENOL) tablet 650 mg  650 mg Oral Q6H PRN Sherrie George, PA       Or  . acetaminophen (TYLENOL) suppository 650 mg  650 mg Rectal Q6H PRN Sherrie George, PA      . dextrose 5 % and 0.45 % NaCl with KCl 20 mEq/L infusion   Intravenous Continuous Sherrie George, PA 75 mL/hr at 02/01/12 2158    . gi cocktail  30 mL Oral Once Gavin Pound. Ghim, MD   30 mL at 02/01/12 0957  . HYDROcodone-acetaminophen (NORCO) 5-325 MG per tablet 1-2 tablet  1-2 tablet Oral Q4H PRN Sherrie George, PA      . HYDROmorphone  (DILAUDID) injection 0.5 mg  0.5 mg Intravenous Once Gavin Pound. Ghim, MD   0.5 mg at 02/01/12 1609  . HYDROmorphone (DILAUDID) injection 0.5-1 mg  0.5-1 mg Intravenous Q1H PRN Sherrie George, PA   0.5 mg at 02/02/12 0459  . HYDROmorphone (DILAUDID) injection 1 mg  1 mg Intravenous Once Gavin Pound. Ghim, MD   1 mg at 02/01/12 1133  . influenza  inactive virus vaccine (FLUZONE/FLUARIX) injection 0.5 mL  0.5 mL Intramuscular Tomorrow-1000 Hurman Horn, MD      . iohexol (OMNIPAQUE) 300 MG/ML solution 100 mL  100 mL Intravenous Once PRN Medication Radiologist, MD   100 mL at 02/01/12 1556  . ondansetron (ZOFRAN) injection 4 mg  4 mg Intravenous Q6H PRN Sherrie George, PA   4 mg at 02/01/12 2154  . pantoprazole (PROTONIX) injection 40 mg  40 mg Intravenous Q12H Sherrie George, [place]   40 mg at 02/01/12 2154  .  piperacillin-tazobactam (ZOSYN) IVPB 3.375 g  3.375 g Intravenous Q8H Sherrie George, [place]   3.375 g at 02/02/12 0454    Assessment/Plan  1. Abdominal pain with a large splenic cyst, and possible rupture.  2. History of H. Pylori,  3. History of NSAID use.  4. Tobacco use  5. Leukocytosis  Plan:  DC antibiotics, mobilze, use PO pain med, recheck CBC in AM.  If ok home tomorrow and f/u in office.  LOS: 1 day    Moua Rasmusson 02/02/2012

## 2012-02-02 NOTE — Progress Notes (Signed)
Large splenic cyst - spontaneously draining with some peritoneal irritation. Does not appear to be bleeding No history of previous trauma to this area. Will manage symptoms Recheck labs - if normal, will d/c tomorrow with outpatient follow-up for possible elective splenectomy vs marsupialization of the cyst.  Wilmon Arms. Corliss Skains, MD, [facility]  02/02/2012 12:08 PM

## 2012-02-03 DIAGNOSIS — R109 Unspecified abdominal pain: Secondary | ICD-10-CM

## 2012-02-03 LAB — CBC
MCV: 81 fL (ref 78.0–100.0)
Platelets: 219 10*3/uL (ref 150–400)
RDW: 14.1 % (ref 11.5–15.5)
WBC: 7.2 10*3/uL (ref 4.0–10.5)

## 2012-02-03 MED ORDER — HYDROCODONE-ACETAMINOPHEN 5-325 MG PO TABS: 1.0000 | ORAL_TABLET | ORAL | Status: AC | PRN

## 2012-02-03 NOTE — Discharge Summary (Signed)
  Physician Discharge Summary [facility]  Patient ID: Taylor Bernard MRN: [id number] DOB/AGE: 09-05-1976 36 y.o.  Admit date: 02/01/2012 Discharge date: 02/03/2012  Admission Diagnoses:  Splenic cyst, abd pain  Discharge Diagnoses:  Active Problems:  * No active hospital problems. *    Discharged Condition: good  Hospital Course: Patient admitted to general surgery service for abdominal pain with finding of large splenic cyst on CT scan.  Hgb stabilized.  No evidence on ongoing hemorrhage.  Pain controlled.  Patient prepared for discharge.  Restrictions on activities given to patient and sister.  Will follow up with Dr. Marcille Blanco in one week to consider splenectomy.  Consults: None  Significant Diagnostic Studies: CT abd  Treatments: potential surgical resection of spleen  Discharge Exam: Blood pressure 100/64, pulse 84, temperature 99.1 F (37.3 C), temperature source Oral, resp. rate 18, height 5\' 3"  (1.6 m), weight 142 lb 13.7 oz (64.8 kg), last menstrual period 11/10/2011, SpO2 100.00%. HEENT - clear Neck - soft Chest - clear Cor - RRR Abd - non-tender; BS present; not distended  Disposition: Home with family  Discharge Orders    Future Orders Please Complete By Expires   Diet - low sodium heart healthy      Increase activity slowly      Discharge instructions      Comments:   Contact office at [phone] for appt with Dr. Marcille Blanco in one week. Call office or come to ER immediately for:  Increased abdominal pain, abdominal swelling, dizziness, nausea, vomiting. Stay out of work one week.  Rest quietly.  No vigorous physical activity. tmg   No wound care        Medication List  As of 02/03/2012 11:32 AM   TAKE these medications         acetaminophen 325 MG tablet   Commonly known as: TYLENOL   Take 2 tablets (650 mg total) by mouth every 6 (six) hours as needed (or Temp > 100).      HYDROcodone-acetaminophen 5-325 MG per tablet   Commonly  known as: NORCO   Take 1-2 tablets by mouth every 4 (four) hours as needed.      HYDROcodone-acetaminophen 5-325 MG per tablet   Commonly known as: NORCO   Take 1-2 tablets by mouth every 4 (four) hours as needed for pain.      ibuprofen 200 MG tablet   Commonly known as: ADVIL,MOTRIN   Take 600 mg by mouth every 6 (six) hours as needed. For migraine      Melatonin 5 MG Tabs   Take 1 tablet by mouth daily.      WELLNESS ESSENTIALS PO   Take 1 tablet by mouth daily. otc           Follow-up Information    Follow up with NEWMAN,DAVID H, MD. Schedule an appointment as soon as possible for a visit in 2 weeks. (Call if you have more pain or fever, or new problems. as needed. Light activiey nothing strenous, no lifting over 10 pounds till you return to see DR. Newman.  Light activity,nothing strenous  till you see DR. Newman. No lifting over 10 pounds.)    Contact information:   [facility] [address], [address] [place] [address] [phone]         Velora Heckler, MD, FACS General & Endocrine Surgery [facility]  Signed: Velora Heckler 02/03/2012, 11:32 AM

## 2012-02-03 NOTE — Progress Notes (Signed)
Assessment unchanged. Pt verbalized understanding of dc instructions. Scripts given as provided by MD. Discharged via wc to front entrance to meet awaiting vehicle to carry home. Accompanied by nurse tech and family.

## 2012-02-05 ENCOUNTER — Telehealth (INDEPENDENT_AMBULATORY_CARE_PROVIDER_SITE_OTHER): Payer: Self-pay | Admitting: Surgery

## 2012-02-05 NOTE — Telephone Encounter (Signed)
Patient called to schedule an appointment, her paperwork says she needs to be seen in 1wk, soonest available is 02/20/12, please call.

## 2012-02-08 ENCOUNTER — Encounter (INDEPENDENT_AMBULATORY_CARE_PROVIDER_SITE_OTHER): Payer: Self-pay | Admitting: General Surgery

## 2012-02-20 ENCOUNTER — Encounter (INDEPENDENT_AMBULATORY_CARE_PROVIDER_SITE_OTHER): Payer: Self-pay | Admitting: Surgery

## 2012-02-20 ENCOUNTER — Ambulatory Visit (INDEPENDENT_AMBULATORY_CARE_PROVIDER_SITE_OTHER): Payer: Medicaid Other | Admitting: Surgery

## 2012-02-20 VITALS — BP 112/74 | HR 68 | Temp 97.8°F | Resp 18 | Ht 63.5 in | Wt 140.8 lb

## 2012-02-20 DIAGNOSIS — D734 Cyst of spleen: Secondary | ICD-10-CM

## 2012-02-20 DIAGNOSIS — D7389 Other diseases of spleen: Secondary | ICD-10-CM

## 2012-02-20 HISTORY — DX: Cyst of spleen: D73.4

## 2012-02-20 NOTE — Progress Notes (Signed)
Patient ID: Taylor Bernard, female   DOB: 27-Dec-1975, 36 y.o.   MRN: [id number]  Chief Complaint  Patient presents with  . Cyst    On spleen    HPI Taylor Bernard is a 36 y.o. female.  Follow-up from hospital HPI This patient was recently hospitalized for left-sided abdominal pain.  CT scan showed a large 10 cm splenic cyst, which showed evidence of some leak with surrounding free fluid, but no active bleeding.  She did have some peritoneal irritation, but this has mostly resolved.  She occasionally has some LUQ tenderness with occasional L shoulder pain.  This is minimal.  She denies any recent or remote history of trauma to her left side.  Appetite and bowel movements are normal. Past Medical History  Diagnosis Date  . No pertinent past medical history   . Cyst     on spleen  . Abdominal pain   . Generalized headaches   . Splenic cyst - 10 cm 02/20/2012    Past Surgical History  Procedure Date  . No past surgeries     Family History  Problem Relation Age of Onset  . Hypertension Mother     renal failure  . Heart failure Mother   . Hypertension Father     cirrhosis    Social History History  Substance Use Topics  . Smoking status: Current Everyday Smoker -- 0.5 packs/day for 5 years  . Smokeless tobacco: Never Used  . Alcohol Use: Yes     occasionally    No Known Allergies  Current Outpatient Prescriptions  Medication Sig Dispense Refill  . acetaminophen (TYLENOL) 325 MG tablet Take 2 tablets (650 mg total) by mouth every 6 (six) hours as needed (or Temp > 100).      Marland Kitchen ibuprofen (ADVIL,MOTRIN) 200 MG tablet Take 600 mg by mouth every 6 (six) hours as needed. For migraine      . Melatonin 5 MG TABS Take 1 tablet by mouth daily.      . Nutritional Supplements (WELLNESS ESSENTIALS PO) Take 1 tablet by mouth daily. otc        Review of Systems Review of Systems  Constitutional: Negative for fever, chills and unexpected weight change.  HENT: Negative for  hearing loss, congestion, sore throat, trouble swallowing and voice change.   Eyes: Negative for visual disturbance.  Respiratory: Negative for cough and wheezing.   Cardiovascular: Negative for chest pain, palpitations and leg swelling.  Gastrointestinal: Positive for abdominal pain. Negative for nausea, vomiting, diarrhea, constipation, blood in stool, abdominal distention and anal bleeding.  Genitourinary: Negative for hematuria, vaginal bleeding and difficulty urinating.  Musculoskeletal: Negative for arthralgias.  Skin: Negative for rash and wound.  Neurological: Positive for headaches. Negative for seizures and syncope.  Hematological: Negative for adenopathy. Does not bruise/bleed easily.  Psychiatric/Behavioral: Negative for confusion.    Blood pressure 112/74, pulse 68, temperature 97.8 F (36.6 C), temperature source Temporal, resp. rate 18, height 5' 3.5" (1.613 m), weight 140 lb 12.8 oz (63.866 kg), last menstrual period 11/10/2011.  Physical Exam Physical Exam WDWN in NAD HEENT:  EOMI, sclera anicteric Neck:  No masses, no thyromegaly Lungs:  CTA bilaterally; normal respiratory effort CV:  Regular rate and rhythm; no murmurs Abd:  +bowel sounds, soft, minimally tender along L costal margin, no masses Ext:  Well-perfused; no edema Skin:  Warm, dry; no sign of jaundice  Data Reviewed Abd U/S - 10 cm complex cystic area in spleen with irregular margins and  internal septations CT A/P - 10.2 x 7.4 x 9.8 cm splenic lesion with similar free fluid in the abdomen and pelvis, consistent with rupture of the cystic lesion  Assessment    Splenic cyst s/p rupture, now with minimal symptoms    Plan    Observation Recheck in 1 month Consider repeating the scan in 6 months to follow-up on the size of the cyst.       Aqueelah Cotrell K. 02/20/2012, 12:20 PM

## 2012-03-19 ENCOUNTER — Encounter (INDEPENDENT_AMBULATORY_CARE_PROVIDER_SITE_OTHER): Payer: Self-pay | Admitting: Surgery

## 2012-03-26 ENCOUNTER — Encounter (INDEPENDENT_AMBULATORY_CARE_PROVIDER_SITE_OTHER): Payer: Self-pay | Admitting: Surgery

## 2012-03-28 ENCOUNTER — Emergency Department (HOSPITAL_COMMUNITY)
Admission: EM | Admit: 2012-03-28 | Discharge: 2012-03-28 | Disposition: A | Discharge status: Home / Self Care | Payer: Medicaid Other | Attending: Emergency Medicine | Admitting: Emergency Medicine

## 2012-03-28 DIAGNOSIS — R599 Enlarged lymph nodes, unspecified: Secondary | ICD-10-CM | POA: Insufficient documentation

## 2012-03-28 DIAGNOSIS — J020 Streptococcal pharyngitis: Secondary | ICD-10-CM | POA: Insufficient documentation

## 2012-03-28 LAB — RAPID STREP SCREEN (MED CTR MEBANE ONLY): Streptococcus, Group A Screen (Direct): POSITIVE — AB

## 2012-03-28 MED ORDER — HYDROCODONE-ACETAMINOPHEN 7.5-500 MG/15ML PO SOLN: 15.0000 mL | Freq: Four times a day (QID) | ORAL | Status: AC | PRN

## 2012-03-28 MED ORDER — LIDOCAINE VISCOUS 2 % MT SOLN
20.0000 mL | Freq: Once | OROMUCOSAL | Status: AC
  Administered 2012-03-28: 20 mL via OROMUCOSAL
  Filled 2012-03-28: qty 15

## 2012-03-28 MED ORDER — LIDOCAINE VISCOUS 2 % MT SOLN: 20.0000 mL | OROMUCOSAL | Status: AC | PRN

## 2012-03-28 MED ORDER — METHYLPREDNISOLONE SODIUM SUCC 125 MG IJ SOLR
125.0000 mg | Freq: Once | INTRAMUSCULAR | Status: AC
  Administered 2012-03-28: 125 mg via INTRAMUSCULAR
  Filled 2012-03-28: qty 2

## 2012-03-28 MED ORDER — CEFTRIAXONE SODIUM 1 G IJ SOLR
1.0000 g | Freq: Once | INTRAMUSCULAR | Status: AC
  Administered 2012-03-28: 1 g via INTRAMUSCULAR
  Filled 2012-03-28: qty 10

## 2012-03-28 NOTE — ED Notes (Signed)
Sore throat x 2 days

## 2012-03-28 NOTE — ED Provider Notes (Signed)
Medical screening examination/treatment/procedure(s) were performed by non-physician practitioner and as supervising physician I was immediately available for consultation/collaboration.   Carleene Cooper III, MD 03/28/12 [phone]

## 2012-03-28 NOTE — Discharge Instructions (Signed)
Pharyngitis, Viral and Bacterial Pharyngitis is soreness (inflammation) or infection of the pharynx. It is also called a sore throat. CAUSES  Most sore throats are caused by viruses and are part of a cold. However, some sore throats are caused by strep and other bacteria. Sore throats can also be caused by post nasal drip from draining sinuses, allergies and sometimes from sleeping with an open mouth. Infectious sore throats can be spread from person to person by coughing, sneezing and sharing cups or eating utensils. TREATMENT  Sore throats that are viral usually last 3-4 days. Viral illness will get better without medications (antibiotics). Strep throat and other bacterial infections will usually begin to get better about 24-48 hours after you begin to take antibiotics. HOME CARE INSTRUCTIONS   If the caregiver feels there is a bacterial infection or if there is a positive strep test, they will prescribe an antibiotic. The full course of antibiotics must be taken. If the full course of antibiotic is not taken, you or your child may become ill again. If you or your child has strep throat and do not finish all of the medication, serious heart or kidney diseases may develop.   Drink enough water and fluids to keep your urine clear or pale yellow.   Only take over-the-counter or prescription medicines for pain, discomfort or fever as directed by your caregiver.   Get lots of rest.   Gargle with salt water ( tsp. of salt in a glass of water) as often as every 1-2 hours as you need for comfort.   Hard candies may soothe the throat if individual is not at risk for choking. Throat sprays or lozenges may also be used.  SEEK MEDICAL CARE IF:   Large, tender lumps in the neck develop.   A rash develops.   Green, yellow-brown or bloody sputum is coughed up.   Your baby is older than 3 months with a rectal temperature of 100.5 F (38.1 C) or higher for more than 1 day.  SEEK IMMEDIATE MEDICAL CARE  IF:   A stiff neck develops.   You or your child are drooling or unable to swallow liquids.   You or your child are vomiting, unable to keep medications or liquids down.   You or your child has severe pain, unrelieved with recommended medications.   You or your child are having difficulty breathing (not due to stuffy nose).   You or your child are unable to fully open your mouth.   You or your child develop redness, swelling, or severe pain anywhere on the neck.   You have a fever.   Your baby is older than 3 months with a rectal temperature of 102 F (38.9 C) or higher.   Your baby is 26 months old or younger with a rectal temperature of 100.4 F (38 C) or higher.  MAKE SURE YOU:   Understand these instructions.   Will watch your condition.   Will get help right away if you are not doing well or get worse.  Document Released: 12/11/2005 Document Revised: 11/30/2011 Document Reviewed: 03/09/2008 [facility] Patient Information 2012 [place], [place].Strep Infections Streptococcal (strep) infections are caused by streptococcal germs (bacteria). Strep infections are very contagious. Strep infections can occur in:  Ears.   The nose.   The throat.   Sinuses.   Skin.   Blood.   Lungs.   Spinal fluid.   Urine.  Strep throat is the most common bacterial infection in children. The symptoms of  a Strep infection usually get better in 2 to 3 days after starting medicine that kills germs (antibiotics). Strep is usually not contagious after 36 to 48 hours of antibiotic treatment. Strep infections that are not treated can cause serious complications. These include gland infections, throat abscess, rheumatic fever and kidney disease. DIAGNOSIS  The diagnosis of strep is made by:  A culture for the strep germ.  TREATMENT  These infections require oral antibiotics for a full 10 days, an antibiotic shot or antibiotics given into the vein (intravenous, IV). HOME CARE INSTRUCTIONS    Be sure to finish all antibiotics even if feeling better.   Only take over-the-counter medicines for pain, discomfort and or fever, as directed by your caregiver.   Close contacts that have a fever, sore throat or illness symptoms should see their caregiver right away.   You or your child may return to work, school or daycare if the fever and pain are better in 2 to 3 days after starting antibiotics.  SEEK MEDICAL CARE IF:   You or your child has an oral temperature above 102 F (38.9 C).   Your baby is older than 3 months with a rectal temperature of 100.5 F (38.1 C) or higher for more than 1 day.   You or your child is not better in 3 days.  SEEK IMMEDIATE MEDICAL CARE IF:   You or your child has an oral temperature above 102 F (38.9 C), not controlled by medicine.   Your baby is older than 3 months with a rectal temperature of 102 F (38.9 C) or higher.   Your baby is 15 months old or younger with a rectal temperature of 100.4 F (38 C) or higher.   There is a spreading rash.   There is difficulty swallowing or breathing.   There is increased pain or swelling.  Document Released: 01/18/2005 Document Revised: 11/30/2011 Document Reviewed: 10/27/2009 [facility] Patient Information 2012 [place], [place].

## 2012-03-28 NOTE — ED Provider Notes (Signed)
History     CSN: [id number]  Arrival date & time 03/28/12  7829   First MD Initiated Contact with Patient 03/28/12 [phone]      Chief Complaint  Patient presents with  . Sore Throat    (Consider location/radiation/quality/duration/timing/severity/associated sxs/prior treatment) HPI  Pt presents to the ED with complaints of sore throat x 2 days. Pt has had strep throat multiple times in the past and still has her tonsils. She states that she feels as though she has had a fever at home. She says it hurts to bad to swallow therefore she is spitting out her saliva. She states that her throat hurts bad on both sides and that one side does not hurt worse than the other. Pt has not taken any medications or has been seen for this episode. She denies weakness, chills, N/V/D, abdominal pain, CP or difficulty braething, or wheezing. No stridor or tripoding noted upon entering the exam room.   Past Medical History  Diagnosis Date  . No pertinent past medical history   . Cyst     on spleen  . Abdominal pain   . Generalized headaches   . Splenic cyst - 10 cm 02/20/2012    Past Surgical History  Procedure Date  . No past surgeries     Family History  Problem Relation Age of Onset  . Hypertension Mother     renal failure  . Heart failure Mother   . Hypertension Father     cirrhosis    History  Substance Use Topics  . Smoking status: Current Everyday Smoker -- 0.5 packs/day for 5 years  . Smokeless tobacco: Never Used  . Alcohol Use: Yes     occasionally    OB History    Grav Para Term Preterm Abortions TAB SAB Ect Mult Living                  Review of Systems  All other systems reviewed and are negative.    Allergies  Review of patient's allergies indicates no known allergies.  Home Medications   Current Outpatient Rx  Name Route Sig Dispense Refill  . ACETAMINOPHEN 325 MG PO TABS Oral Take 2 tablets (650 mg total) by mouth every 6 (six) hours as needed (or Temp >  100).    Marland Kitchen HYDROCODONE-ACETAMINOPHEN 7.5-500 MG/15ML PO SOLN Oral Take 15 mLs by mouth every 6 (six) hours as needed for pain. 120 mL 0  . IBUPROFEN 200 MG PO TABS Oral Take 600 mg by mouth every 6 (six) hours as needed. For migraine    . LIDOCAINE VISCOUS 2 % MT SOLN Oral Take 20 mLs by mouth as needed for pain. 100 mL 0  . MELATONIN 5 MG PO TABS Oral Take 1 tablet by mouth daily.    Joan Flores ESSENTIALS PO Oral Take 1 tablet by mouth daily. otc      BP 118/78  Pulse 78  Temp(Src) 98.8 F (37.1 C) (Oral)  Resp 16  SpO2 99%  Physical Exam  Nursing note and vitals reviewed. Constitutional: She is oriented to person, place, and time. She appears well-developed and well-nourished. No distress.  HENT:  Head: Normocephalic and atraumatic. No trismus in the jaw.  Right Ear: Tympanic membrane, external ear and ear canal normal.  Left Ear: Tympanic membrane, external ear and ear canal normal.  Nose: Nose normal. No rhinorrhea. Right sinus exhibits no maxillary sinus tenderness and no frontal sinus tenderness. Left sinus exhibits no maxillary sinus tenderness  and no frontal sinus tenderness.  Mouth/Throat: Uvula is midline and mucous membranes are normal. Normal dentition. No dental abscesses or uvula swelling. Oropharyngeal exudate and posterior oropharyngeal edema present. No posterior oropharyngeal erythema or tonsillar abscesses.       No submental edema, tongue not elevated, no trismus. No impending airway obstruction; Pt able to speak full sentences, swallow intact, no drooling, stridor, or tonsillar/uvula displacement. No palatal petechia  Eyes: Conjunctivae are normal.  Neck: Trachea normal, normal range of motion and full passive range of motion without pain. Neck supple. No rigidity. Erythema present. Normal range of motion present. No Brudzinski's sign noted.       Flexion and extension of neck without pain or difficulty. Able to breath without difficulty in extension.  Cardiovascular:  Normal rate and regular rhythm.   Pulmonary/Chest: Effort normal and breath sounds normal. No stridor. No respiratory distress. She has no wheezes.  Abdominal: Soft. There is no tenderness.       No obvious evidence of splenomegaly. Non ttp.   Musculoskeletal: Normal range of motion.  Lymphadenopathy:       Head (right side): No preauricular and no posterior auricular adenopathy present.       Head (left side): No preauricular and no posterior auricular adenopathy present.    She has cervical adenopathy.  Neurological: She is alert and oriented to person, place, and time.  Skin: Skin is warm and dry. No rash noted. She is not diaphoretic.  Psychiatric: She has a normal mood and affect.    ED Course  Procedures (including critical care time)  Labs Reviewed  RAPID STREP SCREEN - Abnormal; Notable for the following:    Streptococcus, Group A Screen (Direct) POSITIVE (*)    All other components within normal limits   No results found.   1. Strep pharyngitis       MDM  Pt given IM 125 solumedrol in ED and 1g Rocephin. Pt given Rx for lidocaine viscous for pain an Lortab Elixir.  Pt also given referral for ENT due to recurrent strep infections.  Pt has been advised of the symptoms that warrant their return to the ED. Patient has voiced understanding and has agreed to follow-up with the PCP or specialist.         Dorthula Matas, PA 03/28/12 [phone]

## 2012-06-03 ENCOUNTER — Encounter (INDEPENDENT_AMBULATORY_CARE_PROVIDER_SITE_OTHER): Payer: Self-pay | Admitting: Surgery

## 2012-06-05 ENCOUNTER — Emergency Department (HOSPITAL_COMMUNITY)
Admission: EM | Admit: 2012-06-05 | Discharge: 2012-06-05 | Disposition: A | Discharge status: Home / Self Care | Payer: Medicaid Other | Attending: Emergency Medicine | Admitting: Emergency Medicine

## 2012-06-05 ENCOUNTER — Emergency Department (HOSPITAL_COMMUNITY): Payer: Medicaid Other

## 2012-06-05 ENCOUNTER — Encounter (HOSPITAL_COMMUNITY): Payer: Self-pay | Admitting: *Deleted

## 2012-06-05 DIAGNOSIS — M549 Dorsalgia, unspecified: Secondary | ICD-10-CM | POA: Insufficient documentation

## 2012-06-05 DIAGNOSIS — D7389 Other diseases of spleen: Secondary | ICD-10-CM | POA: Insufficient documentation

## 2012-06-05 DIAGNOSIS — D734 Cyst of spleen: Secondary | ICD-10-CM

## 2012-06-05 DIAGNOSIS — R109 Unspecified abdominal pain: Secondary | ICD-10-CM | POA: Insufficient documentation

## 2012-06-05 LAB — CBC
HCT: 37.1 % (ref 36.0–46.0)
MCHC: 32.9 g/dL (ref 30.0–36.0)
MCV: 80.5 fL (ref 78.0–100.0)
RDW: 15 % (ref 11.5–15.5)

## 2012-06-05 LAB — COMPREHENSIVE METABOLIC PANEL
Albumin: 3.6 g/dL (ref 3.5–5.2)
BUN: 7 mg/dL (ref 6–23)
Creatinine, Ser: 0.6 mg/dL (ref 0.50–1.10)
Potassium: 3.5 mEq/L (ref 3.5–5.1)
Total Protein: 7.6 g/dL (ref 6.0–8.3)

## 2012-06-05 LAB — URINALYSIS, ROUTINE W REFLEX MICROSCOPIC
Protein, ur: NEGATIVE mg/dL
Urobilinogen, UA: 0.2 mg/dL (ref 0.0–1.0)

## 2012-06-05 LAB — LIPASE, BLOOD: Lipase: 26 U/L (ref 11–59)

## 2012-06-05 LAB — POCT PREGNANCY, URINE: Preg Test, Ur: NEGATIVE

## 2012-06-05 LAB — URINE MICROSCOPIC-ADD ON

## 2012-06-05 MED ORDER — SULFAMETHOXAZOLE-TRIMETHOPRIM 800-160 MG PO TABS: 1.0000 | ORAL_TABLET | Freq: Two times a day (BID) | ORAL | Status: AC

## 2012-06-05 MED ORDER — SULFAMETHOXAZOLE-TRIMETHOPRIM 800-160 MG PO TABS: 1.0000 | ORAL_TABLET | Freq: Two times a day (BID) | ORAL | Status: DC

## 2012-06-05 MED ORDER — HYDROCODONE-ACETAMINOPHEN 5-325 MG PO TABS: 1.0000 | ORAL_TABLET | Freq: Four times a day (QID) | ORAL | Status: AC | PRN

## 2012-06-05 MED ORDER — ONDANSETRON HCL 4 MG/2ML IJ SOLN
4.0000 mg | Freq: Once | INTRAMUSCULAR | Status: AC
  Administered 2012-06-05: 4 mg via INTRAVENOUS
  Filled 2012-06-05: qty 2

## 2012-06-05 MED ORDER — HYDROMORPHONE HCL PF 1 MG/ML IJ SOLN
1.0000 mg | Freq: Once | INTRAMUSCULAR | Status: AC
  Administered 2012-06-05: 1 mg via INTRAVENOUS
  Filled 2012-06-05: qty 1

## 2012-06-05 MED ORDER — IBUPROFEN 800 MG PO TABS
800.0000 mg | ORAL_TABLET | Freq: Once | ORAL | Status: AC
  Administered 2012-06-05: 800 mg via ORAL
  Filled 2012-06-05: qty 1

## 2012-06-05 NOTE — ED Provider Notes (Signed)
History     CSN: [id number]  Arrival date & time 06/05/12  4098   First MD Initiated Contact with Patient 06/05/12 [phone]      Chief Complaint  Patient presents with  . Back Pain    rt side    (Consider location/radiation/quality/duration/timing/severity/associated sxs/prior treatment) Patient is a 36 y.o. female presenting with back pain. The history is provided by the patient.  Back Pain  This is a new problem. The current episode started more than 2 days ago. The problem occurs constantly. The problem has been gradually worsening. The pain is associated with no known injury. Pain location: right flank. The quality of the pain is described as stabbing (waxing and waning, intense at times). The pain is severe. Pertinent negatives include no chest pain, no fever, no numbness, no perianal numbness, no dysuria, no leg pain, no paresis, no tingling and no weakness. She has tried NSAIDs for the symptoms. The treatment provided no relief.    Past Medical History  Diagnosis Date  . No pertinent past medical history   . Cyst     on spleen  . Abdominal pain   . Generalized headaches   . Splenic cyst - 10 cm 02/20/2012    Past Surgical History  Procedure Date  . No past surgeries     Family History  Problem Relation Age of Onset  . Hypertension Mother     renal failure  . Heart failure Mother   . Hypertension Father     cirrhosis    History  Substance Use Topics  . Smoking status: Current Everyday Smoker -- 0.5 packs/day for 5 years  . Smokeless tobacco: Never Used  . Alcohol Use: Yes     occasionally    OB History    Grav Para Term Preterm Abortions TAB SAB Ect Mult Living                  Review of Systems  Constitutional: Negative for fever.  Cardiovascular: Negative for chest pain.  Genitourinary: Negative for dysuria.  Musculoskeletal: Positive for back pain.  Neurological: Negative for tingling, weakness and numbness.    Allergies  Review of patient's  allergies indicates no known allergies.  Home Medications   Current Outpatient Rx  Name Route Sig Dispense Refill  . ACETAMINOPHEN 325 MG PO TABS Oral Take 2 tablets (650 mg total) by mouth every 6 (six) hours as needed (or Temp > 100).    . IBUPROFEN 200 MG PO TABS Oral Take 600 mg by mouth every 6 (six) hours as needed. For migraine    . MELATONIN 5 MG PO TABS Oral Take 1 tablet by mouth daily.      BP 107/70  Pulse 76  Temp 98.5 F (36.9 C)  Resp 20  SpO2 98%  Physical Exam  Nursing note and vitals reviewed. Constitutional: She appears well-developed and well-nourished. No distress.  HENT:  Head: Normocephalic and atraumatic.  Right Ear: External ear normal.  Left Ear: External ear normal.  Eyes: Conjunctivae are normal. Right eye exhibits no discharge. Left eye exhibits no discharge. No scleral icterus.  Neck: Neck supple. No tracheal deviation present.  Cardiovascular: Normal rate, regular rhythm and intact distal pulses.   Pulmonary/Chest: Effort normal and breath sounds normal. No stridor. No respiratory distress. She has no wheezes. She has no rales.  Abdominal: Soft. Bowel sounds are normal. She exhibits no distension. There is no tenderness. There is CVA tenderness (right). There is no rebound and no  guarding.  Musculoskeletal: She exhibits no edema and no tenderness.       Thoracic back: She exhibits tenderness. She exhibits no bony tenderness and no swelling.       Lumbar back: Normal.  Neurological: She is alert. She has normal strength. No sensory deficit. Cranial nerve deficit:  no gross defecits noted. She exhibits normal muscle tone. She displays no seizure activity. Coordination normal.  Skin: Skin is warm and dry. No rash noted.  Psychiatric: She has a normal mood and affect.    ED Course  Procedures (including critical care time)  Labs Reviewed  URINALYSIS, ROUTINE W REFLEX MICROSCOPIC - Abnormal; Notable for the following:    Color, Urine AMBER (*)   BIOCHEMICALS MAY BE AFFECTED BY COLOR   APPearance CLOUDY (*)     Hgb urine dipstick TRACE (*)     Ketones, ur TRACE (*)     Leukocytes, UA TRACE (*)     All other components within normal limits  COMPREHENSIVE METABOLIC PANEL - Abnormal; Notable for the following:    Total Bilirubin 0.2 (*)     All other components within normal limits  URINE MICROSCOPIC-ADD ON - Abnormal; Notable for the following:    Squamous Epithelial / LPF MANY (*)     Bacteria, UA MANY (*)     All other components within normal limits  POCT PREGNANCY, URINE  CBC  LIPASE, BLOOD  URINE CULTURE   Ct Abdomen Pelvis Wo Contrast  06/05/2012  *RADIOLOGY REPORT*  Clinical Data: Sudden onset right flank pain, history of splenomegaly with splenic cyst  CT ABDOMEN AND PELVIS WITHOUT CONTRAST  Technique:  Multidetector CT imaging of the abdomen and pelvis was performed following the standard protocol without intravenous contrast.  Comparison: 02/01/2012  Findings: Mild dependent atelectasis with trace bilateral pleural effusions.  Unenhanced liver, pancreas, and left adrenal gland are within normal limits.  Right adrenal gland is not discretely visualized.  Spleen is incompletely visualized but enlarged, measuring greater than 16 cm. Associated with fluid density splenic lesion has also increased, now measuring 14.1 x 10.6 by at least 13.5 cm, previous of the 10.2 x 7.4 x 9.7 cm.  Scattered peripheral calcifications (series 2/images 17-21).  Gallbladder is underdistended.  No intrahepatic or extrahepatic ductal dilatation.  Kidneys are within normal limits.  No renal calculi or hydronephrosis.  No evidence of bowel obstruction.  Normal appendix.  No evidence of abdominal aortic aneurysm.  No abdominopelvic ascites, improved.  No suspicious abdominopelvic lymphadenopathy.  Uterus and bilateral ovaries are grossly unremarkable.  No ureteral or bladder calculi.  Bladder is mildly thick-walled.  Mild levoscoliosis of the thoracolumbar  spine.  IMPRESSION: No renal, ureteral, or bladder calculi.  No hydronephrosis.  Normal appendix.  No evidence of bowel obstruction.  Splenomegaly, increased.  Enlarging cystic splenic lesion, measuring at least 14.1 cm, etiology uncertain.  Interval enlargement may reflect reaccumulation of fluid within a cyst following rupture suspected on prior study.  Differential considerations include a benign lymphangioma or hemangioma.  Given interval change, consider follow- up in 3 months (preferably MRI with/without contrast), or earlier as clinically warranted.  Bladder is mildly thick-walled, correlate for cystitis.  Original Report Authenticated By: Charline Bills, M.D.     1. Flank pain   2. Splenic cyst       MDM  Patient presents to the ermergency room  with flank pain. Is possible the symptoms could be related to a urinary tract infection or may be musculoskeletal in nature.  She has no signs of any kidney stones or other acute abdominal pathology on CT scan patient has a known splenic cyst however this appears to be increasing in size .  At this time he did not feel that is related to pain she is having as her pain is on the right side. She was discharged home with course of antibiotics and pain medications. I recommend followup with her primary care Dr. Patient is to return to the ER as needed for worsening symptoms       Celene Kras, MD 06/05/12 1144

## 2012-06-05 NOTE — ED Notes (Signed)
Pt states that her back began hurting on Sunday.  Pt points to rt flank when c/o pain.  Denies urinary sx.  LMP 05/10/12.  Periods aren't regular. Pt states the pain is sharp and comes and goes.  Nothing makes it better or worse.

## 2012-06-05 NOTE — Discharge Instructions (Signed)
Your CT scan shows that the spleen cyst is enlarging in size. Follow up with your doctor. They recommend a followup MRI in 3 months to have this rechecked. CT scan did not show any signs of any abnormalities in a kidney or abdomen area. He may have a urinary tract infection however at be contributing to her symptoms.  Flank Pain Flank pain refers to pain that is located on the side of the body between the upper abdomen and the back. It can be caused by many things. CAUSES  Some of the more common causes of flank pain include:  Muscle strain.   Muscle spasms.   A disease of your spine (vertebral disk disease).   A lung infection (pneumonia).   Fluid around your lungs (pulmonary edema).   A kidney infection.   Kidney stones.   A very painful skin rash on only one side of your body (shingles).   Gallbladder disease.  DIAGNOSIS  Blood tests, urine tests, and X-rays may help your caregiver determine what is wrong. TREATMENT  The treatment of pain depends on the cause. Your caregiver will determine what treatment will work best for you. HOME CARE INSTRUCTIONS   Home care will depend on the cause of your pain.   Some medications may help relieve the pain. Take medication for relief of pain as directed by your caregiver.   Tell your caregiver about any changes in your pain.   Follow up with your caregiver.  SEEK IMMEDIATE MEDICAL CARE IF:   Your pain is not controlled with medication.   The pain increases.   You have abdominal pain.   You have shortness of breath.   You have persistent nausea or vomiting.   You have swelling in your abdomen.   You feel faint or pass out.   You have a temperature by mouth above 102 F (38.9 C), not controlled by medicine.  MAKE SURE YOU:   Understand these instructions.   Will watch your condition.   Will get help right away if you are not doing well or get worse.  Document Released: 02/01/2006 Document Revised: 11/30/2011  Document Reviewed: 05/28/2010 [facility] Patient Information 2012 [place], [place].

## 2012-06-05 NOTE — ED Notes (Signed)
Rt sided back pain for about 2 days, no urinary symptoms,

## 2012-06-07 LAB — URINE CULTURE: Culture  Setup Time: 201306121443

## 2012-06-08 NOTE — ED Notes (Signed)
+  Urine. Patient given Septra DS. No sensitivity listed. Chart sent to EDP office for review. °

## 2012-06-10 NOTE — ED Notes (Signed)
Ok Per Dr Oletta Lamas.

## 2012-06-13 ENCOUNTER — Encounter (INDEPENDENT_AMBULATORY_CARE_PROVIDER_SITE_OTHER): Payer: Self-pay | Admitting: Surgery

## 2012-06-13 ENCOUNTER — Ambulatory Visit (INDEPENDENT_AMBULATORY_CARE_PROVIDER_SITE_OTHER): Payer: Medicaid Other | Admitting: Surgery

## 2012-06-13 VITALS — BP 106/68 | HR 71 | Temp 97.2°F | Resp 16 | Ht 63.0 in | Wt 137.2 lb

## 2012-06-13 DIAGNOSIS — D734 Cyst of spleen: Secondary | ICD-10-CM

## 2012-06-13 DIAGNOSIS — D7389 Other diseases of spleen: Secondary | ICD-10-CM

## 2012-06-13 NOTE — Progress Notes (Signed)
Follow-up of her splenic cyst. The patient has been asymptomatic as far as the left side of her abdomen.  She was in the emergency department 2 weeks ago with some right flank pain. A repeat CT scan showed no gallstones or kidney stones. There was no clear etiology for her right flank pain.her right flank pain is improved. The patient reports no problems with appetite or bowel movements.  Ct Abdomen Pelvis Wo Contrast  06/05/2012 *RADIOLOGY REPORT* Clinical Data: Sudden onset right flank pain, history of splenomegaly with splenic cyst CT ABDOMEN AND PELVIS WITHOUT CONTRAST Technique: Multidetector CT imaging of the abdomen and pelvis was performed following the standard protocol without intravenous contrast. Comparison: 02/01/2012 Findings: Mild dependent atelectasis with trace bilateral pleural effusions. Unenhanced liver, pancreas, and left adrenal gland are within normal limits. Right adrenal gland is not discretely visualized. Spleen is incompletely visualized but enlarged, measuring greater than 16 cm. Associated with fluid density splenic lesion has also increased, now measuring 14.1 x 10.6 by at least 13.5 cm, previous of the 10.2 x 7.4 x 9.7 cm. Scattered peripheral calcifications (series 2/images 17-21). Gallbladder is underdistended. No intrahepatic or extrahepatic ductal dilatation. Kidneys are within normal limits. No renal calculi or hydronephrosis. No evidence of bowel obstruction. Normal appendix. No evidence of abdominal aortic aneurysm. No abdominopelvic ascites, improved. No suspicious abdominopelvic lymphadenopathy. Uterus and bilateral ovaries are grossly unremarkable. No ureteral or bladder calculi. Bladder is mildly thick-walled. Mild levoscoliosis of the thoracolumbar spine. IMPRESSION: No renal, ureteral, or bladder calculi. No hydronephrosis. Normal appendix. No evidence of bowel obstruction. Splenomegaly, increased. Enlarging cystic splenic lesion, measuring at least 14.1 cm, etiology  uncertain. Interval enlargement may reflect reaccumulation of fluid within a cyst following rupture suspected on prior study. Differential considerations include a benign lymphangioma or hemangioma. Given interval change, consider follow- up in 3 months (preferably MRI with/without contrast), or earlier as clinically warranted. Bladder is mildly thick-walled, correlate for cystitis. Original Report Authenticated By: Charline Bills, M.D.   Filed Vitals:   06/13/12 1058  BP: 106/68  Pulse: 71  Temp: 97.2 F (36.2 C)  Resp: 16   On physical examination, her abdomen is soft and nontender. No palpable masses in the upper abdomen. No left flank pain.  Impression: Splenic cyst measuring 14.1 cm.This is enlarged from 10.2 cm on the last scan after a suspected rupture. This may represent reaccumulation of fluid. The patient is asymptomatic regarding this cyst. Recommend interval followup in about 3 months.at that time we will consider a repeat scan to evaluate the cyst. If this continues to enlarge then we'll consider elective laparoscopic splenectomy.  Follow-up 3 months

## 2012-09-06 ENCOUNTER — Encounter (INDEPENDENT_AMBULATORY_CARE_PROVIDER_SITE_OTHER): Payer: Medicaid Other | Admitting: Surgery

## 2012-09-11 ENCOUNTER — Encounter (INDEPENDENT_AMBULATORY_CARE_PROVIDER_SITE_OTHER): Payer: Medicaid Other | Admitting: Surgery

## 2012-09-18 ENCOUNTER — Encounter (INDEPENDENT_AMBULATORY_CARE_PROVIDER_SITE_OTHER): Payer: Self-pay | Admitting: Surgery

## 2012-12-25 IMAGING — CT CT ABD-PELV W/O CM
1 series · 13 of 16 positions shown, 19 images · non-contrast
Comparison: 02/01/2012

CLINICAL DATA: Sudden onset right flank pain, history of
splenomegaly with splenic cyst

CT ABDOMEN AND PELVIS WITHOUT CONTRAST
TECHNIQUE: Multidetector CT imaging of the abdomen and pelvis was
performed following the standard protocol without intravenous
contrast.

[Series 7: lung · axial · 0.71mm/px · z∈[-136,-71]mm · 13 of 16 slices shown, 19 images]
[im 2/16  soft-tissue]
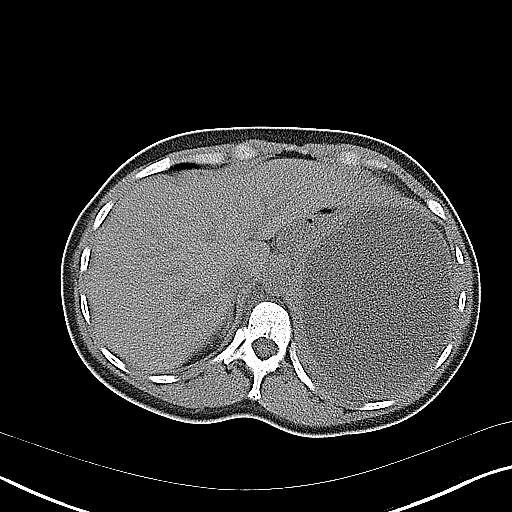
[im 2/16  bone]
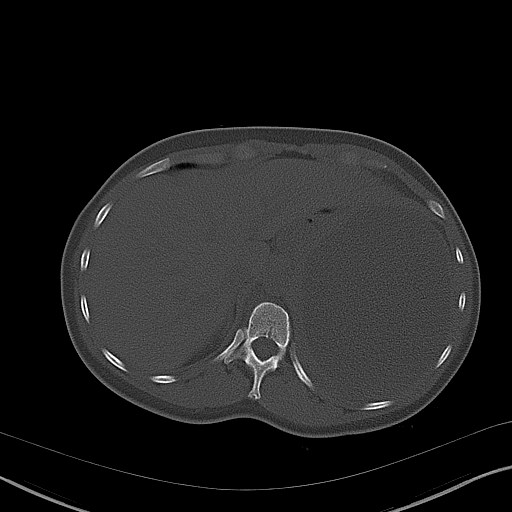
[im 3/16  soft-tissue]
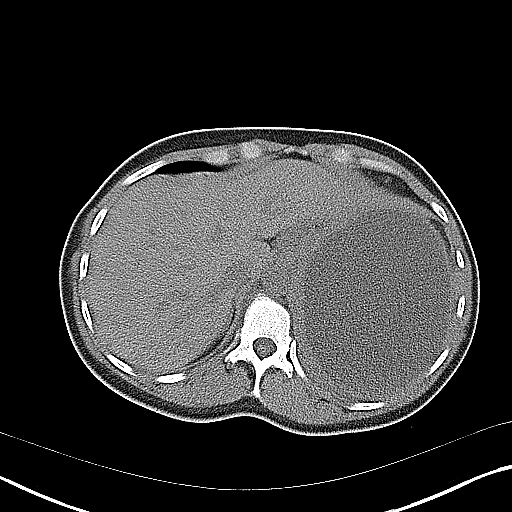
[im 4/16  soft-tissue]
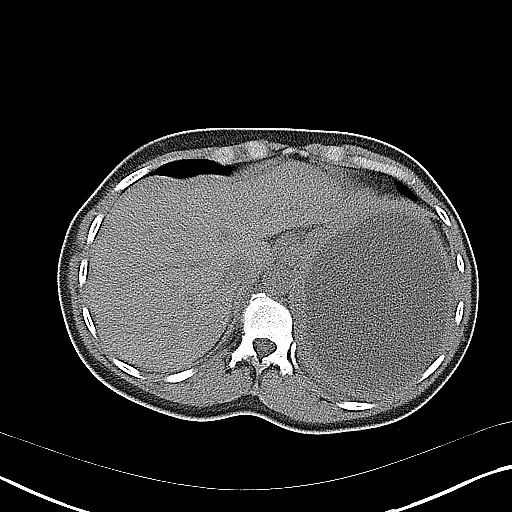
[im 5/16  soft-tissue]
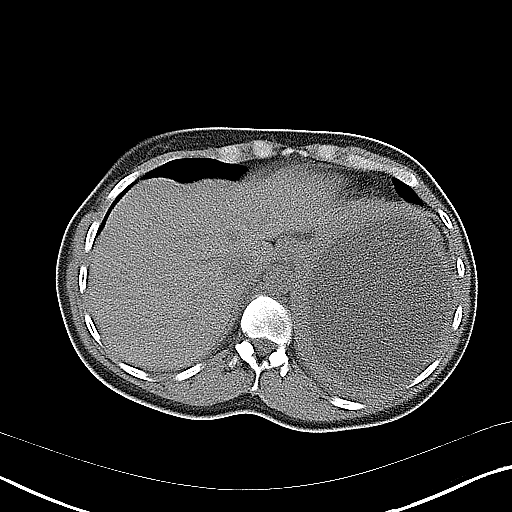
[im 6/16  soft-tissue]
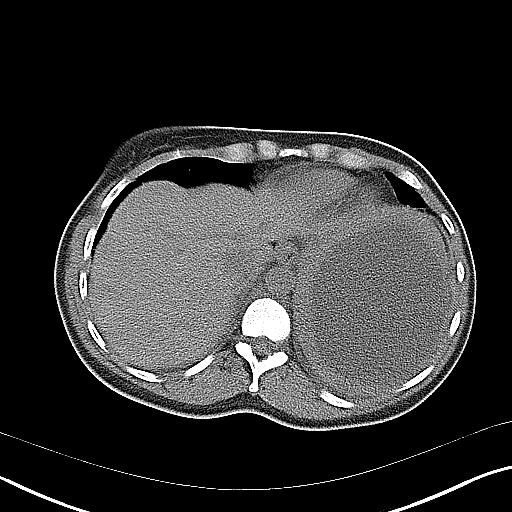
[im 7/16  soft-tissue]
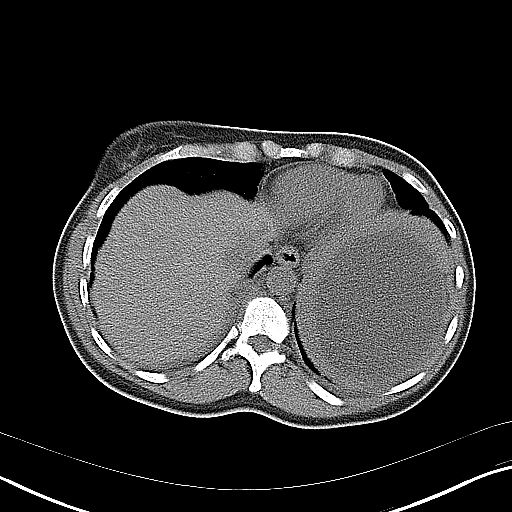
[im 9/16  soft-tissue]
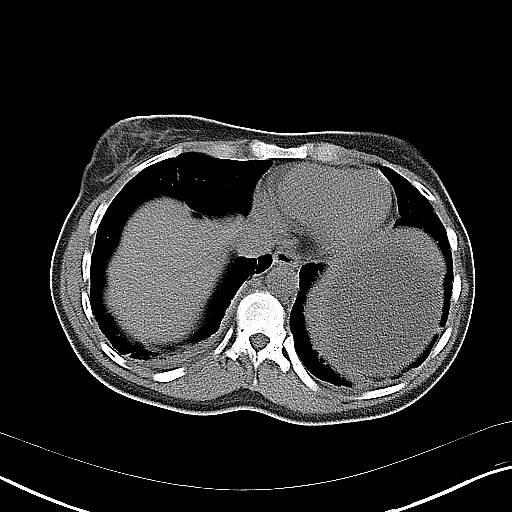
[im 10/16  soft-tissue]
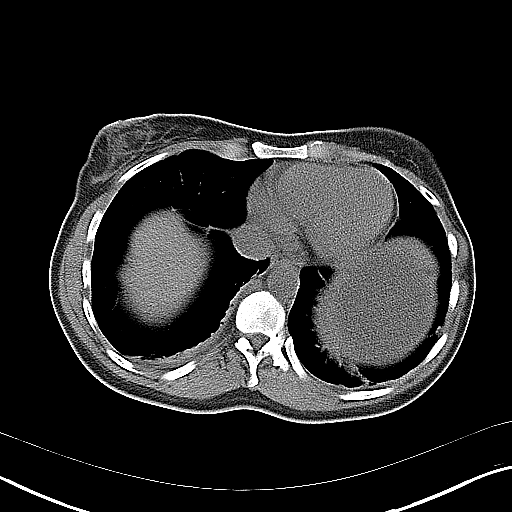
[im 11/16  soft-tissue]
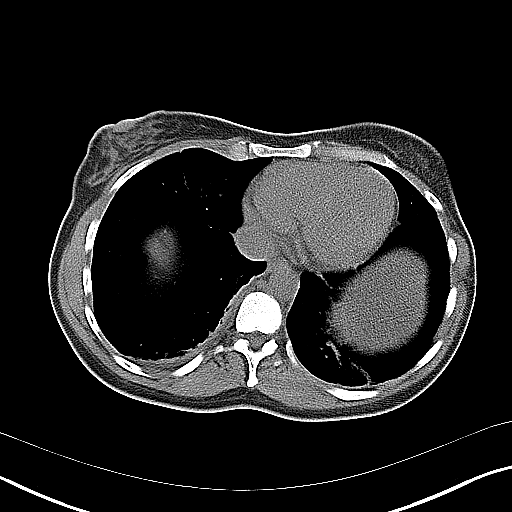
[im 11/16  bone]
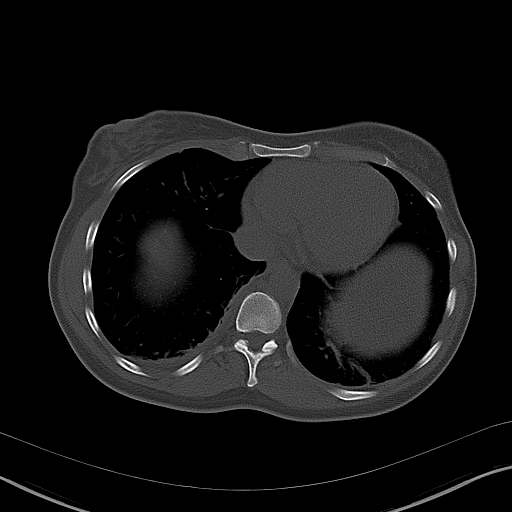
[im 12/16  soft-tissue]
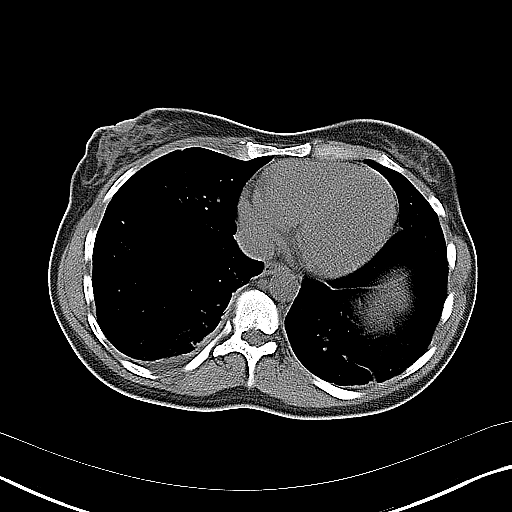
[im 12/16  lung]
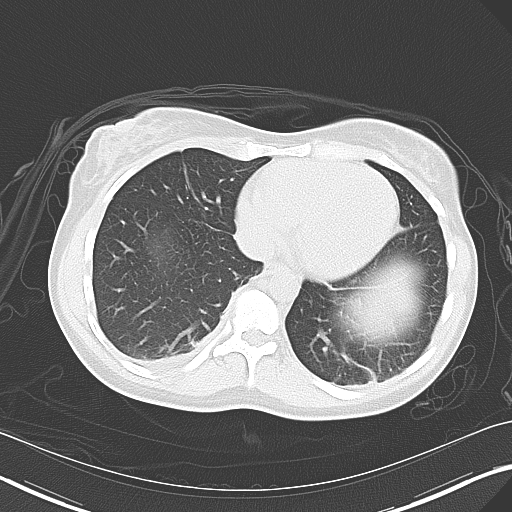
[im 13/16  soft-tissue]
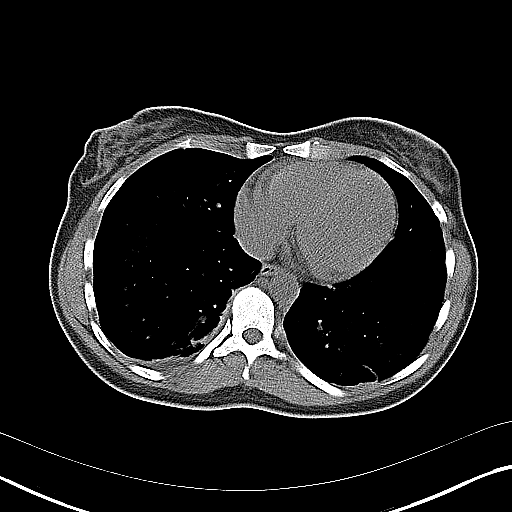
[im 13/16  lung]
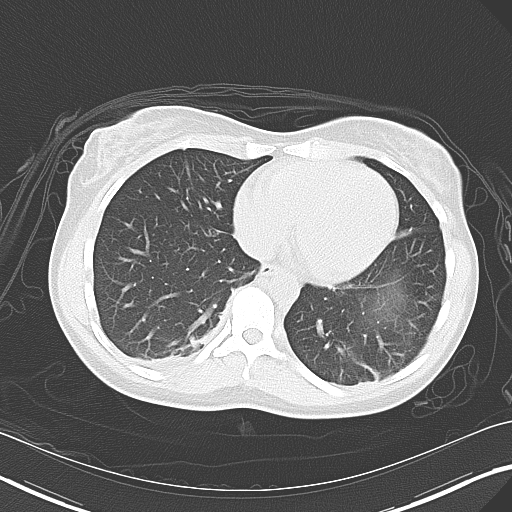
[im 14/16  soft-tissue]
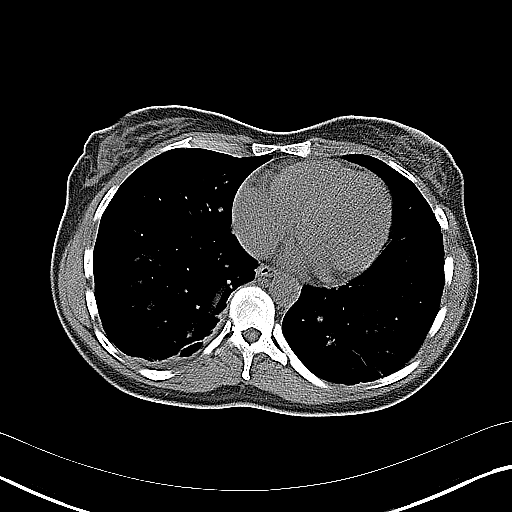
[im 14/16  lung]
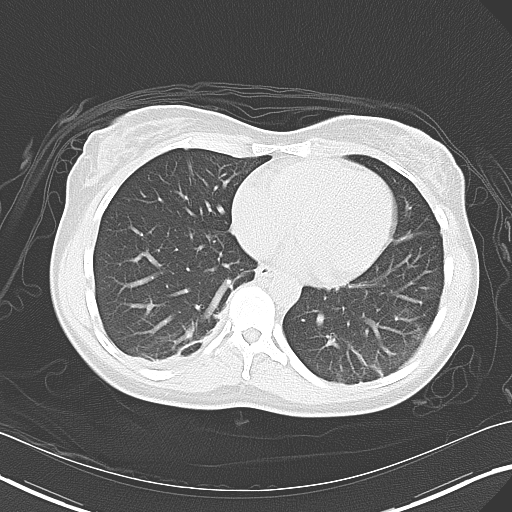
[im 15/16  soft-tissue]
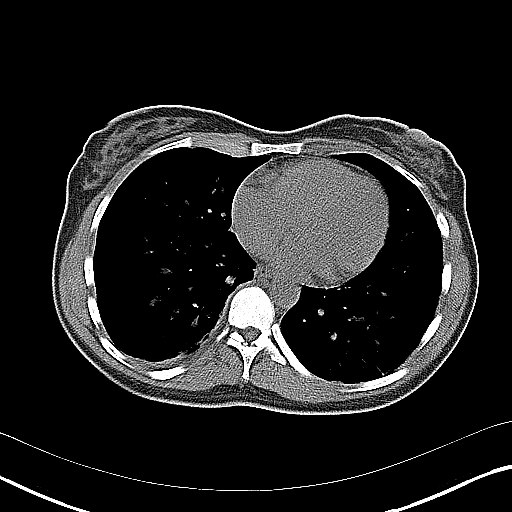
[im 15/16  lung]
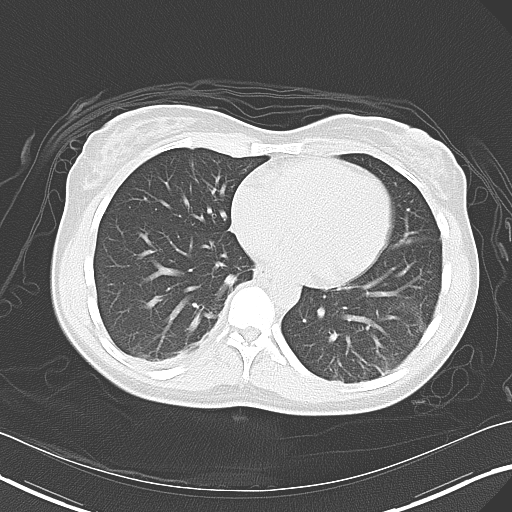

[13 of 16 positions shown; findings below may reference images not displayed]

FINDINGS: Mild dependent atelectasis with trace bilateral pleural
effusions.

Unenhanced liver, pancreas, and left adrenal gland are within
normal limits.  Right adrenal gland is not discretely visualized.

Spleen is incompletely visualized but enlarged, measuring greater
than 16 cm. Associated with fluid density splenic lesion has also
increased, now measuring 14.1 x 10.6 by at least 13.5 cm, previous
of the 10.2 x 7.4 x 9.7 cm.  Scattered peripheral calcifications
(series 2/images 17-21).

Gallbladder is underdistended.  No intrahepatic or extrahepatic
ductal dilatation.

Kidneys are within normal limits.  No renal calculi or
hydronephrosis.

No evidence of bowel obstruction.  Normal appendix.

No evidence of abdominal aortic aneurysm.

No abdominopelvic ascites, improved.

No suspicious abdominopelvic lymphadenopathy.

Uterus and bilateral ovaries are grossly unremarkable.

No ureteral or bladder calculi.  Bladder is mildly thick-walled.

Mild levoscoliosis of the thoracolumbar spine.
IMPRESSION: No renal, ureteral, or bladder calculi.  No hydronephrosis.

Normal appendix.  No evidence of bowel obstruction.

Splenomegaly, increased.

Enlarging cystic splenic lesion, measuring at least 14.1 cm,
etiology uncertain.  Interval enlargement may reflect
reaccumulation of fluid within a cyst following rupture suspected
on prior study.  Differential considerations include a benign
lymphangioma or hemangioma.  Given interval change, consider follow-
up in 3 months (preferably MRI with/without contrast), or earlier
as clinically warranted.

Bladder is mildly thick-walled, correlate for cystitis.

## 2016-09-09 LAB — LAB REPORT - SCANNED

## 2018-03-14 ENCOUNTER — Encounter (HOSPITAL_COMMUNITY): Payer: Self-pay | Admitting: Emergency Medicine

## 2018-03-14 ENCOUNTER — Ambulatory Visit (HOSPITAL_COMMUNITY)
Admission: EM | Admit: 2018-03-14 | Discharge: 2018-03-14 | Disposition: A | Discharge status: Home / Self Care | Payer: 59 | Attending: Family Medicine | Admitting: Family Medicine

## 2018-03-14 ENCOUNTER — Other Ambulatory Visit: Payer: Self-pay

## 2018-03-14 DIAGNOSIS — M5441 Lumbago with sciatica, right side: Secondary | ICD-10-CM

## 2018-03-14 MED ORDER — METHOCARBAMOL 500 MG PO TABS: 500.0000 mg | ORAL_TABLET | Freq: Two times a day (BID) | ORAL | 0 refills | Status: AC

## 2018-03-14 MED ORDER — PREDNISONE 10 MG (21) PO TBPK: ORAL_TABLET | Freq: Every day | ORAL | 0 refills | Status: AC

## 2018-03-14 MED ORDER — HYDROCODONE-ACETAMINOPHEN 5-325 MG PO TABS: 1.0000 | ORAL_TABLET | ORAL | 0 refills | Status: AC | PRN

## 2018-03-14 NOTE — ED Provider Notes (Signed)
[facility]    CSN: [id number] Arrival date & time: 03/14/18  1551     History   Chief Complaint Chief Complaint  Patient presents with  . Back Pain    HPI Taylor Bernard is a 42 y.o. female.   42 year old female, presenting today complaining of right-sided low back pain.  States that the pain started last night.  Pain has been persistent since the onset and gradually worsening.  Pain radiates into the right leg.  She denies any associated numbness, tingling.  Denies any known injury.  Denies any loss of bowel or bladder function, saddle anesthesia, numbness or weakness in the lower extremities.  She has not tried anything at home for her pain.  States that she does have a history of recurrent back pain.  The history is provided by the patient.  Back Pain  Location:  Lumbar spine Quality:  Aching Radiates to:  R posterior upper leg Pain severity:  Moderate Pain is:  Same all the time Onset quality:  Gradual Duration:  2 days Timing:  Constant Progression:  Unchanged Chronicity:  New Context: not emotional stress, not falling, not jumping from heights, not lifting heavy objects, not MCA, not MVA, not occupational injury and not pedestrian accident   Relieved by:  Nothing Worsened by:  Movement, sitting and twisting Ineffective treatments:  None tried Associated symptoms: leg pain   Associated symptoms: no abdominal pain, no abdominal swelling, no bladder incontinence, no bowel incontinence, no chest pain, no dysuria, no fever, no headaches, no numbness, no paresthesias, no pelvic pain, no perianal numbness, no tingling, no weakness and no weight loss   Risk factors: no hx of cancer, no hx of osteoporosis, no lack of exercise, no menopause, not obese, not pregnant, no recent surgery, no steroid use and no vascular disease     Past Medical History:  Diagnosis Date  . Abdominal pain   . Cyst    on spleen  . Generalized headaches   . No pertinent past medical  history   . Splenic cyst - 10 cm 02/20/2012    Patient Active Problem List   Diagnosis Date Noted  . Splenic cyst - 10 cm 02/20/2012    Past Surgical History:  Procedure Laterality Date  . NO PAST SURGERIES      OB History   None      Home Medications    Prior to Admission medications   Medication Sig Start Date End Date Taking? Authorizing Provider  aspirin-acetaminophen-caffeine (EXCEDRIN MIGRAINE) [phone] MG tablet Take by mouth every 6 (six) hours as needed for headache.   Yes [provider]  amoxicillin (AMOXIL) 500 MG capsule  05/24/12   [provider]  HYDROcodone-acetaminophen (NORCO/VICODIN) 5-325 MG tablet Take 1 tablet by mouth every 4 (four) hours as needed. 03/14/18   Blue, Olivia C, PA-C  ibuprofen (ADVIL,MOTRIN) 200 MG tablet Take 600 mg by mouth every 6 (six) hours as needed. For migraine    [provider]  Melatonin 5 MG TABS Take 1 tablet by mouth daily.    [provider]  methocarbamol (ROBAXIN) 500 MG tablet Take 1 tablet (500 mg total) by mouth 2 (two) times daily. 03/14/18   Blue, Olivia C, PA-C  predniSONE (STERAPRED UNI-PAK 21 TAB) 10 MG (21) TBPK tablet Take by mouth daily. Take 6 tabs by mouth daily  for 2 days, then 5 tabs for 2 days, then 4 tabs for 2 days, then 3 tabs for 2 days, 2 tabs  for 2 days, then 1 tab by mouth daily for 2 days 03/14/18   Alecia LemmingBlue, Olivia C, PA-C    Family History Family History  Problem Relation Age of Onset  . Hypertension Mother        renal failure  . Heart failure Mother   . Hypertension Father        cirrhosis    Social History Social History   Tobacco Use  . Smoking status: Current Every Day Smoker    Packs/day: 0.50    Years: 5.00    Pack years: 2.50  . Smokeless tobacco: Never Used  Substance Use Topics  . Alcohol use: Yes    Comment: occasionally  . Drug use: No     Allergies   Patient has no known allergies.   Review of Systems Review of Systems    Constitutional: Negative for chills, fever and weight loss.  HENT: Negative for ear pain and sore throat.   Eyes: Negative for pain and visual disturbance.  Respiratory: Negative for cough and shortness of breath.   Cardiovascular: Negative for chest pain and palpitations.  Gastrointestinal: Negative for abdominal pain, bowel incontinence and vomiting.  Genitourinary: Negative for bladder incontinence, dysuria, hematuria and pelvic pain.  Musculoskeletal: Positive for back pain. Negative for arthralgias.  Skin: Negative for color change and rash.  Neurological: Negative for tingling, seizures, syncope, weakness, numbness, headaches and paresthesias.  All other systems reviewed and are negative.    Physical Exam Triage Vital Signs ED Triage Vitals  Enc Vitals Group     BP 03/14/18 1640 121/74     Pulse Rate 03/14/18 1640 92     Resp --      Temp 03/14/18 1640 98.3 F (36.8 C)     Temp Source 03/14/18 1640 Oral     SpO2 03/14/18 1640 100 %     Weight --      Height --      Head Circumference --      Peak Flow --      Pain Score 03/14/18 1638 9     Pain Loc --      Pain Edu? --      Excl. in GC? --    No data found.  Updated Vital Signs BP 121/74 (BP Location: Left Arm)   Pulse 92   Temp 98.3 F (36.8 C) (Oral)   LMP 03/12/2018   SpO2 100%   Visual Acuity Right Eye Distance:   Left Eye Distance:   Bilateral Distance:    Right Eye Near:   Left Eye Near:    Bilateral Near:     Physical Exam  Constitutional: She is oriented to person, place, and time. She appears well-developed and well-nourished. No distress.  HENT:  Head: Normocephalic.  Eyes: Pupils are equal, round, and reactive to light. EOM are normal.  Neck: Normal range of motion.  Cardiovascular: Normal rate.  Pulmonary/Chest: Effort normal and breath sounds normal.  Abdominal: Soft.  Musculoskeletal: Normal range of motion.       Lumbar back: She exhibits tenderness.       Back:  Neurological:  She is alert and oriented to person, place, and time. She has normal strength and normal reflexes. No cranial nerve deficit or sensory deficit. She displays a negative Romberg sign. GCS eye subscore is 4. GCS verbal subscore is 5. GCS motor subscore is 6.  Reflex Scores:      Tricep reflexes are 2+ on the right side and 2+ on  the left side.      Bicep reflexes are 2+ on the right side and 2+ on the left side.      Brachioradialis reflexes are 2+ on the right side and 2+ on the left side.      Patellar reflexes are 2+ on the right side and 2+ on the left side.      Achilles reflexes are 2+ on the right side and 2+ on the left side. Skin: Skin is warm. She is not diaphoretic.  Psychiatric: She has a normal mood and affect.  Nursing note and vitals reviewed.    UC Treatments / Results  Labs (all labs ordered are listed, but only abnormal results are displayed) Labs Reviewed - No data to display  EKG  EKG Interpretation None       Radiology No results found.  Procedures Procedures (including critical care time)  Medications Ordered in UC Medications - No data to display   Initial Impression / Assessment and Plan / UC Course  I have reviewed the triage vital signs and the nursing notes.  Pertinent labs & imaging results that were available during my care of the patient were reviewed by me and considered in my medical decision making (see chart for details).     Right-sided low back pain with right-sided sciatica.  Patient has a normal neurologic exam without focal findings.  No red flag symptoms.  Do not believe that imaging of her lumbar spine is necessary at this time.  Will treat symptomatically for sciatica with pain medication, muscle relaxants and steroids.  She is given return precautions as well as expectations as the course of her back pain.  Final Clinical Impressions(s) / UC Diagnoses   Final diagnoses:  Acute right-sided low back pain with right-sided sciatica     ED Discharge Orders        Ordered    predniSONE (STERAPRED UNI-PAK 21 TAB) 10 MG (21) TBPK tablet  Daily     03/14/18 1652    methocarbamol (ROBAXIN) 500 MG tablet  2 times daily     03/14/18 1652    HYDROcodone-acetaminophen (NORCO/VICODIN) 5-325 MG tablet  Every 4 hours PRN     03/14/18 1652       Controlled Substance Prescriptions [place] Controlled Substance Registry consulted? Yes, I have consulted the [place] Controlled Substances Registry for this patient, and feel the risk/benefit ratio today is favorable for proceeding with this prescription for a controlled substance.   Alecia Lemming, [place] 03/14/18 1700

## 2018-03-14 NOTE — ED Triage Notes (Signed)
C/o low back pain that is descending down right leg onset last PM

## 2020-10-30 ENCOUNTER — Other Ambulatory Visit: Payer: 59

## 2024-06-23 ENCOUNTER — Emergency Department (HOSPITAL_BASED_OUTPATIENT_CLINIC_OR_DEPARTMENT_OTHER)
Admission: EM | Admit: 2024-06-23 | Discharge: 2024-06-23 | Disposition: A | Discharge status: Home / Self Care | Payer: PRIVATE HEALTH INSURANCE | Attending: Emergency Medicine | Admitting: Emergency Medicine

## 2024-06-23 ENCOUNTER — Encounter (HOSPITAL_BASED_OUTPATIENT_CLINIC_OR_DEPARTMENT_OTHER): Payer: Self-pay | Admitting: Emergency Medicine

## 2024-06-23 ENCOUNTER — Other Ambulatory Visit: Payer: Self-pay

## 2024-06-23 ENCOUNTER — Emergency Department (HOSPITAL_BASED_OUTPATIENT_CLINIC_OR_DEPARTMENT_OTHER): Payer: Self-pay | Admitting: Radiology

## 2024-06-23 DIAGNOSIS — M25572 Pain in left ankle and joints of left foot: Secondary | ICD-10-CM | POA: Diagnosis present

## 2024-06-23 DIAGNOSIS — W108XXA Fall (on) (from) other stairs and steps, initial encounter: Secondary | ICD-10-CM | POA: Insufficient documentation

## 2024-06-23 DIAGNOSIS — S93402A Sprain of unspecified ligament of left ankle, initial encounter: Secondary | ICD-10-CM | POA: Diagnosis not present

## 2024-06-23 DIAGNOSIS — Z7982 Long term (current) use of aspirin: Secondary | ICD-10-CM | POA: Diagnosis not present

## 2024-06-23 NOTE — ED Provider Notes (Signed)
 [place] EMERGENCY DEPARTMENT AT [facility] Provider Note   CSN: [id number] Arrival date & time: 06/23/24  9285     Patient presents with: Ankle Pain   Taylor Bernard is a 48 y.o. female.   HPI   48 year old female presents today complaining of left ankle pain.  She states she rolled her ankle when she fell down a couple of steps yesterday.  She has some pain and swelling to the left lateral aspect.  She was able to put some weight on it to come into the ED this morning.  She denies any other injury.  Specifically she denies any injury to her head, neck, knee, hip, back.  She is not on any blood thinners.  She states she only takes 1 medication for mood stabilization to help her quit smoking.  Prior to Admission medications   Medication Sig Start Date End Date Taking? Authorizing Provider  amoxicillin (AMOXIL) 500 MG capsule  05/24/12   [provider]  aspirin-acetaminophen -caffeine (EXCEDRIN MIGRAINE) 250-250-65 MG tablet Take by mouth every 6 (six) hours as needed for headache.    [provider]  HYDROcodone -acetaminophen  (NORCO/VICODIN) 5-325 MG tablet Take 1 tablet by mouth every 4 (four) hours as needed. 03/14/18   Blue, Olivia C, PA-C  ibuprofen  (ADVIL ,MOTRIN ) 200 MG tablet Take 600 mg by mouth every 6 (six) hours as needed. For migraine    [provider]  Melatonin 5 MG TABS Take 1 tablet by mouth daily.    [provider]  methocarbamol  (ROBAXIN ) 500 MG tablet Take 1 tablet (500 mg total) by mouth 2 (two) times daily. 03/14/18   Blue, Olivia C, PA-C  predniSONE  (STERAPRED UNI-PAK 21 TAB) 10 MG (21) TBPK tablet Take by mouth daily. Take 6 tabs by mouth daily  for 2 days, then 5 tabs for 2 days, then 4 tabs for 2 days, then 3 tabs for 2 days, 2 tabs for 2 days, then 1 tab by mouth daily for 2 days 03/14/18   Blue, Olivia C, PA-C    Allergies: Patient has no known allergies.    Review of Systems  Updated Vital Signs BP 117/82    Pulse 91   Temp 98.5 F (36.9 C) (Oral)   Resp 14   SpO2 97%   Physical Exam Vitals and nursing note reviewed.  Constitutional:      General: She is not in acute distress.    Appearance: Normal appearance. She is well-developed.  HENT:     Head: Normocephalic and atraumatic.     Right Ear: External ear normal.     Left Ear: External ear normal.     Nose: Nose normal.  Eyes:     Conjunctiva/sclera: Conjunctivae normal.     Pupils: Pupils are equal, round, and reactive to light.  Pulmonary:     Effort: Pulmonary effort is normal.  Musculoskeletal:     Cervical back: Normal range of motion and neck supple.     Comments: Left ankle with some tenderness over the distal ankle lateral malleolus and just distal to the fibula.  There is no obvious injury but may have some slight swelling.  There is no tenderness over the foot or proximally over the leg or knee Pulses, sensation, and motor intact  Skin:    General: Skin is warm and dry.  Neurological:     Mental Status: She is alert and oriented to person, place, and time.     Motor: No abnormal muscle tone.  Coordination: Coordination normal.  Psychiatric:        Behavior: Behavior normal.        Thought Content: Thought content normal.     (all labs ordered are listed, but only abnormal results are displayed) Labs Reviewed - No data to display  EKG: None  Radiology: No results found.   Procedures   Medications Ordered in the ED - No data to display  Clinical Course as of 07/03/24 1021  Mon Jun 23, 2024  0828 Right ankle x-[facility] reviewed interpreted no evidence of acute fracture noted and radiologist interpretation notes soft tissue swelling over lateral malleolus [DR]    Clinical Course User Index [DR] Levander Houston, MD                                 Medical Decision Making Amount and/or Complexity of Data Reviewed Radiology: ordered.        Final diagnoses:  Sprain of left ankle, unspecified ligament,  initial encounter    ED Discharge Orders     None          Levander Houston, MD 07/03/24 1021

## 2024-06-23 NOTE — Discharge Instructions (Addendum)
 Please use ace as needed for compression, elevate, and use cold therapy.  You may use over-the-counter ibuprofen  or acetaminophen  as needed for pain. You may begin bearing weight as you tolerated. If you continue to have ongoing pain, please follow-up at urgent care or an orthopedist of your choice.

## 2024-06-23 NOTE — ED Triage Notes (Signed)
 States rolled left ankle while walking down steps yesterday.

## 2024-06-23 NOTE — ED Notes (Signed)
 Pt given discharge instructions. Opportunities given for questions. Pt verbalizes understanding. Pt ambulated off department with crutches. Tolerated well.  Bethena Powell SAUNDERS, RN

## 2025-01-08 ENCOUNTER — Other Ambulatory Visit: Payer: Self-pay | Admitting: Family

## 2025-01-08 DIAGNOSIS — Z1231 Encounter for screening mammogram for malignant neoplasm of breast: Secondary | ICD-10-CM

## 2025-01-16 ENCOUNTER — Ambulatory Visit: Admission: RE | Admit: 2025-01-16 | Payer: PRIVATE HEALTH INSURANCE | Source: Ambulatory Visit

## 2025-01-16 DIAGNOSIS — Z1231 Encounter for screening mammogram for malignant neoplasm of breast: Secondary | ICD-10-CM
